# Patient Record
Sex: Female | Born: 1945
Health system: Southern US, Community
[De-identification: ages and names within clinical notes are randomized; demographics above are authoritative.]

## PROBLEM LIST (undated history)

## (undated) DIAGNOSIS — T7840XA Allergy, unspecified, initial encounter: Secondary | ICD-10-CM

## (undated) DIAGNOSIS — Z860101 Personal history of adenomatous and serrated colon polyps: Secondary | ICD-10-CM

## (undated) DIAGNOSIS — Z86010 Personal history of colonic polyps: Secondary | ICD-10-CM

## (undated) DIAGNOSIS — M1990 Unspecified osteoarthritis, unspecified site: Secondary | ICD-10-CM

## (undated) DIAGNOSIS — K219 Gastro-esophageal reflux disease without esophagitis: Secondary | ICD-10-CM

## (undated) DIAGNOSIS — H269 Unspecified cataract: Secondary | ICD-10-CM

## (undated) DIAGNOSIS — M8580 Other specified disorders of bone density and structure, unspecified site: Secondary | ICD-10-CM

## (undated) DIAGNOSIS — M810 Age-related osteoporosis without current pathological fracture: Secondary | ICD-10-CM

## (undated) DIAGNOSIS — E785 Hyperlipidemia, unspecified: Secondary | ICD-10-CM

## (undated) HISTORY — PX: COLONOSCOPY: SHX174

## (undated) HISTORY — DX: Age-related osteoporosis without current pathological fracture: M81.0

## (undated) HISTORY — DX: Hyperlipidemia, unspecified: E78.5

## (undated) HISTORY — DX: Personal history of adenomatous and serrated colon polyps: Z86.0101

## (undated) HISTORY — DX: Personal history of colonic polyps: Z86.010

## (undated) HISTORY — DX: Allergy, unspecified, initial encounter: T78.40XA

## (undated) HISTORY — DX: Unspecified osteoarthritis, unspecified site: M19.90

## (undated) HISTORY — DX: Gastro-esophageal reflux disease without esophagitis: K21.9

## (undated) HISTORY — DX: Unspecified cataract: H26.9

## (undated) HISTORY — DX: Other specified disorders of bone density and structure, unspecified site: M85.80

---

## 1995-12-14 HISTORY — PX: ABDOMINAL HYSTERECTOMY: SHX81

## 1999-03-17 ENCOUNTER — Other Ambulatory Visit: Admission: RE | Admit: 1999-03-17 | Discharge: 1999-03-17 | Payer: Self-pay | Admitting: Radiology

## 2000-06-30 ENCOUNTER — Other Ambulatory Visit: Admission: RE | Admit: 2000-06-30 | Discharge: 2000-06-30 | Payer: Self-pay | Admitting: *Deleted

## 2001-07-27 ENCOUNTER — Other Ambulatory Visit: Admission: RE | Admit: 2001-07-27 | Discharge: 2001-07-27 | Payer: Self-pay | Admitting: *Deleted

## 2002-05-31 ENCOUNTER — Ambulatory Visit (HOSPITAL_COMMUNITY): Admission: RE | Admit: 2002-05-31 | Discharge: 2002-05-31 | Payer: Self-pay | Admitting: Gastroenterology

## 2002-06-12 LAB — HM COLONOSCOPY

## 2002-10-09 ENCOUNTER — Other Ambulatory Visit: Admission: RE | Admit: 2002-10-09 | Discharge: 2002-10-09 | Payer: Self-pay | Admitting: Family Medicine

## 2004-10-16 ENCOUNTER — Ambulatory Visit: Payer: Self-pay | Admitting: Family Medicine

## 2004-10-17 ENCOUNTER — Ambulatory Visit: Payer: Self-pay | Admitting: Family Medicine

## 2004-10-19 ENCOUNTER — Ambulatory Visit: Payer: Self-pay | Admitting: Family Medicine

## 2004-10-27 ENCOUNTER — Ambulatory Visit: Payer: Self-pay | Admitting: Family Medicine

## 2004-10-30 ENCOUNTER — Ambulatory Visit: Payer: Self-pay | Admitting: Family Medicine

## 2004-11-04 ENCOUNTER — Ambulatory Visit: Payer: Self-pay | Admitting: Family Medicine

## 2004-11-04 ENCOUNTER — Other Ambulatory Visit: Admission: RE | Admit: 2004-11-04 | Discharge: 2004-11-04 | Payer: Self-pay | Admitting: Family Medicine

## 2004-12-25 ENCOUNTER — Ambulatory Visit: Payer: Self-pay | Admitting: Family Medicine

## 2006-02-07 ENCOUNTER — Ambulatory Visit: Payer: Self-pay | Admitting: Family Medicine

## 2006-03-21 ENCOUNTER — Ambulatory Visit: Payer: Self-pay | Admitting: Gastroenterology

## 2006-03-24 ENCOUNTER — Ambulatory Visit: Payer: Self-pay | Admitting: Family Medicine

## 2006-07-13 ENCOUNTER — Ambulatory Visit: Payer: Self-pay | Admitting: Family Medicine

## 2007-06-01 ENCOUNTER — Telehealth: Payer: Self-pay | Admitting: Family Medicine

## 2007-06-26 ENCOUNTER — Encounter: Payer: Self-pay | Admitting: Family Medicine

## 2007-07-05 ENCOUNTER — Encounter (INDEPENDENT_AMBULATORY_CARE_PROVIDER_SITE_OTHER): Payer: Self-pay | Admitting: *Deleted

## 2007-07-14 ENCOUNTER — Encounter: Payer: Self-pay | Admitting: Internal Medicine

## 2007-07-14 DIAGNOSIS — E785 Hyperlipidemia, unspecified: Secondary | ICD-10-CM

## 2007-07-14 DIAGNOSIS — L309 Dermatitis, unspecified: Secondary | ICD-10-CM

## 2007-07-14 DIAGNOSIS — J309 Allergic rhinitis, unspecified: Secondary | ICD-10-CM | POA: Insufficient documentation

## 2007-07-19 DIAGNOSIS — M810 Age-related osteoporosis without current pathological fracture: Secondary | ICD-10-CM

## 2007-07-24 ENCOUNTER — Ambulatory Visit: Payer: Self-pay | Admitting: Family Medicine

## 2007-07-27 LAB — CONVERTED CEMR LAB
ALT: 21 units/L (ref 0–35)
AST: 26 units/L (ref 0–37)
BUN: 12 mg/dL (ref 6–23)
Basophils Absolute: 0 10*3/uL (ref 0.0–0.1)
Basophils Relative: 0.7 % (ref 0.0–1.0)
CO2: 30 meq/L (ref 19–32)
Calcium: 9 mg/dL (ref 8.4–10.5)
Chloride: 103 meq/L (ref 96–112)
Cholesterol: 152 mg/dL (ref 0–200)
Creatinine, Ser: 0.7 mg/dL (ref 0.4–1.2)
Eosinophils Absolute: 0.1 10*3/uL (ref 0.0–0.6)
Eosinophils Relative: 2.5 % (ref 0.0–5.0)
GFR calc Af Amer: 109 mL/min
GFR calc non Af Amer: 90 mL/min
Glucose, Bld: 91 mg/dL (ref 70–99)
HCT: 40 % (ref 36.0–46.0)
HDL: 63.6 mg/dL (ref >39.0)
Hemoglobin: 13.7 g/dL (ref 12.0–15.0)
LDL Cholesterol: 76 mg/dL (ref 0–99)
Lymphocytes Relative: 25 % (ref 12.0–46.0)
MCHC: 34.3 g/dL (ref 30.0–36.0)
MCV: 95.4 fL (ref 78.0–100.0)
Monocytes Absolute: 0.4 10*3/uL (ref 0.2–0.7)
Monocytes Relative: 7.1 % (ref 3.0–11.0)
Neutro Abs: 3.6 10*3/uL (ref 1.4–7.7)
Neutrophils Relative %: 64.7 % (ref 43.0–77.0)
Platelets: 240 10*3/uL (ref 150–400)
Potassium: 4.4 meq/L (ref 3.5–5.1)
RBC: 4.19 M/uL (ref 3.87–5.11)
RDW: 11.4 % — ABNORMAL LOW (ref 11.5–14.6)
Sodium: 139 meq/L (ref 135–145)
Total CHOL/HDL Ratio: 2.4
Triglycerides: 60 mg/dL (ref 0–149)
VLDL: 12 mg/dL (ref 0–40)
WBC: 5.5 10*3/uL (ref 4.5–10.5)

## 2007-09-21 ENCOUNTER — Encounter: Payer: Self-pay | Admitting: Family Medicine

## 2007-09-22 ENCOUNTER — Telehealth (INDEPENDENT_AMBULATORY_CARE_PROVIDER_SITE_OTHER): Payer: Self-pay | Admitting: *Deleted

## 2007-09-26 ENCOUNTER — Ambulatory Visit: Payer: Self-pay | Admitting: Family Medicine

## 2007-10-31 ENCOUNTER — Telehealth (INDEPENDENT_AMBULATORY_CARE_PROVIDER_SITE_OTHER): Payer: Self-pay | Admitting: *Deleted

## 2007-12-21 ENCOUNTER — Encounter (INDEPENDENT_AMBULATORY_CARE_PROVIDER_SITE_OTHER): Payer: Self-pay | Admitting: Internal Medicine

## 2008-01-05 ENCOUNTER — Ambulatory Visit: Payer: Self-pay | Admitting: Family Medicine

## 2008-02-06 ENCOUNTER — Ambulatory Visit: Payer: Self-pay | Admitting: Family Medicine

## 2008-07-15 ENCOUNTER — Encounter (INDEPENDENT_AMBULATORY_CARE_PROVIDER_SITE_OTHER): Payer: Self-pay | Admitting: Internal Medicine

## 2008-07-17 ENCOUNTER — Encounter (INDEPENDENT_AMBULATORY_CARE_PROVIDER_SITE_OTHER): Payer: Self-pay | Admitting: Internal Medicine

## 2008-07-17 ENCOUNTER — Encounter (INDEPENDENT_AMBULATORY_CARE_PROVIDER_SITE_OTHER): Payer: Self-pay | Admitting: *Deleted

## 2008-10-11 ENCOUNTER — Ambulatory Visit: Payer: Self-pay | Admitting: Internal Medicine

## 2008-10-11 ENCOUNTER — Encounter (INDEPENDENT_AMBULATORY_CARE_PROVIDER_SITE_OTHER): Payer: Self-pay | Admitting: Internal Medicine

## 2008-10-11 ENCOUNTER — Other Ambulatory Visit: Admission: RE | Admit: 2008-10-11 | Discharge: 2008-10-11 | Payer: Self-pay | Admitting: Family Medicine

## 2008-10-11 LAB — CONVERTED CEMR LAB: Pap Smear: NORMAL

## 2008-10-11 LAB — HM PAP SMEAR

## 2008-10-14 LAB — CONVERTED CEMR LAB
ALT: 29 units/L (ref 0–35)
AST: 31 units/L (ref 0–37)
BUN: 11 mg/dL (ref 6–23)
CO2: 31 meq/L (ref 19–32)
Calcium: 9 mg/dL (ref 8.4–10.5)
Chloride: 107 meq/L (ref 96–112)
Cholesterol: 175 mg/dL (ref 0–200)
GFR calc Af Amer: 108 mL/min
GFR calc non Af Amer: 90 mL/min
Glucose, Bld: 92 mg/dL (ref 70–99)
HDL: 74.2 mg/dL (ref >39.0)
LDL Cholesterol: 87 mg/dL (ref 0–99)
Potassium: 4.4 meq/L (ref 3.5–5.1)
Sodium: 143 meq/L (ref 135–145)
Total CHOL/HDL Ratio: 2.4
Triglycerides: 67 mg/dL (ref 0–149)
VLDL: 13 mg/dL (ref 0–40)

## 2008-10-17 ENCOUNTER — Encounter (INDEPENDENT_AMBULATORY_CARE_PROVIDER_SITE_OTHER): Payer: Self-pay | Admitting: *Deleted

## 2008-11-01 ENCOUNTER — Ambulatory Visit: Payer: Self-pay | Admitting: Family Medicine

## 2008-11-01 LAB — FECAL OCCULT BLOOD, GUAIAC: Fecal Occult Blood: NEGATIVE

## 2008-11-11 ENCOUNTER — Encounter (INDEPENDENT_AMBULATORY_CARE_PROVIDER_SITE_OTHER): Payer: Self-pay | Admitting: *Deleted

## 2008-11-11 LAB — CONVERTED CEMR LAB
OCCULT 1: NEGATIVE
OCCULT 3: NEGATIVE

## 2008-11-13 ENCOUNTER — Telehealth (INDEPENDENT_AMBULATORY_CARE_PROVIDER_SITE_OTHER): Payer: Self-pay | Admitting: *Deleted

## 2009-04-07 ENCOUNTER — Ambulatory Visit: Payer: Self-pay | Admitting: Family Medicine

## 2009-04-09 LAB — CONVERTED CEMR LAB
ALT: 20 units/L (ref 0–35)
AST: 27 units/L (ref 0–37)
Cholesterol: 168 mg/dL (ref 0–200)
HDL: 65 mg/dL (ref >39.00)
LDL Cholesterol: 89 mg/dL (ref 0–99)
Total CHOL/HDL Ratio: 3
Triglycerides: 71 mg/dL (ref 0.0–149.0)
VLDL: 14.2 mg/dL (ref 0.0–40.0)

## 2009-05-03 ENCOUNTER — Ambulatory Visit: Payer: Self-pay | Admitting: Family Medicine

## 2009-05-05 ENCOUNTER — Ambulatory Visit: Payer: Self-pay | Admitting: Family Medicine

## 2009-05-14 ENCOUNTER — Ambulatory Visit: Payer: Self-pay | Admitting: Family Medicine

## 2009-05-21 ENCOUNTER — Encounter (INDEPENDENT_AMBULATORY_CARE_PROVIDER_SITE_OTHER): Payer: Self-pay | Admitting: Internal Medicine

## 2009-05-21 ENCOUNTER — Ambulatory Visit: Payer: Self-pay | Admitting: Family Medicine

## 2009-07-24 ENCOUNTER — Encounter: Payer: Self-pay | Admitting: Family Medicine

## 2009-08-01 ENCOUNTER — Encounter (INDEPENDENT_AMBULATORY_CARE_PROVIDER_SITE_OTHER): Payer: Self-pay | Admitting: *Deleted

## 2009-10-07 ENCOUNTER — Ambulatory Visit: Payer: Self-pay | Admitting: Family Medicine

## 2009-10-15 ENCOUNTER — Ambulatory Visit: Payer: Self-pay | Admitting: Family Medicine

## 2009-10-15 DIAGNOSIS — N952 Postmenopausal atrophic vaginitis: Secondary | ICD-10-CM

## 2009-10-15 LAB — CONVERTED CEMR LAB
Cholesterol, target level: 200 mg/dL
HDL goal, serum: 40 mg/dL
LDL Goal: 160 mg/dL

## 2009-10-31 ENCOUNTER — Ambulatory Visit: Payer: Self-pay | Admitting: Family Medicine

## 2009-11-04 LAB — CONVERTED CEMR LAB
ALT: 26 units/L (ref 0–35)
AST: 26 units/L (ref 0–37)
Albumin: 4.4 g/dL (ref 3.5–5.2)
BUN: 13 mg/dL (ref 6–23)
Basophils Absolute: 0 10*3/uL (ref 0.0–0.1)
Basophils Relative: 0.7 % (ref 0.0–3.0)
CO2: 33 meq/L — ABNORMAL HIGH (ref 19–32)
Calcium: 9.2 mg/dL (ref 8.4–10.5)
Chloride: 105 meq/L (ref 96–112)
Cholesterol: 173 mg/dL (ref 0–200)
Creatinine, Ser: 0.7 mg/dL (ref 0.4–1.2)
Eosinophils Absolute: 0.1 10*3/uL (ref 0.0–0.7)
Eosinophils Relative: 2.7 % (ref 0.0–5.0)
GFR calc non Af Amer: 89.08 mL/min (ref >60)
HCT: 41.6 % (ref 36.0–46.0)
HDL: 67.4 mg/dL (ref >39.00)
Hemoglobin: 14.2 g/dL (ref 12.0–15.0)
LDL Cholesterol: 95 mg/dL (ref 0–99)
Lymphocytes Relative: 26.1 % (ref 12.0–46.0)
Lymphs Abs: 1.4 10*3/uL (ref 0.7–4.0)
MCHC: 34.2 g/dL (ref 30.0–36.0)
MCV: 97.8 fL (ref 78.0–100.0)
Monocytes Absolute: 0.4 10*3/uL (ref 0.1–1.0)
Monocytes Relative: 7.5 % (ref 3.0–12.0)
Neutro Abs: 3.4 10*3/uL (ref 1.4–7.7)
Neutrophils Relative %: 63 % (ref 43.0–77.0)
Phosphorus: 3.9 mg/dL (ref 2.3–4.6)
Platelets: 242 10*3/uL (ref 150.0–400.0)
Potassium: 4.5 meq/L (ref 3.5–5.1)
RBC: 4.25 M/uL (ref 3.87–5.11)
RDW: 11.6 % (ref 11.5–14.6)
Sodium: 142 meq/L (ref 135–145)
TSH: 2.06 microintl units/mL (ref 0.35–5.50)
Total CHOL/HDL Ratio: 3
Triglycerides: 54 mg/dL (ref 0.0–149.0)
VLDL: 10.8 mg/dL (ref 0.0–40.0)
Vit D, 25-Hydroxy: 50 ng/mL (ref 30–89)
WBC: 5.3 10*3/uL (ref 4.5–10.5)

## 2010-01-16 ENCOUNTER — Ambulatory Visit: Payer: Self-pay | Admitting: Family Medicine

## 2010-01-16 DIAGNOSIS — J3489 Other specified disorders of nose and nasal sinuses: Secondary | ICD-10-CM | POA: Insufficient documentation

## 2010-02-02 ENCOUNTER — Telehealth: Payer: Self-pay | Admitting: Family Medicine

## 2010-07-07 ENCOUNTER — Ambulatory Visit: Payer: Self-pay | Admitting: Family Medicine

## 2010-07-07 DIAGNOSIS — K219 Gastro-esophageal reflux disease without esophagitis: Secondary | ICD-10-CM

## 2010-08-18 ENCOUNTER — Telehealth: Payer: Self-pay | Admitting: Family Medicine

## 2010-09-01 ENCOUNTER — Ambulatory Visit: Payer: Self-pay | Admitting: Family Medicine

## 2010-09-01 ENCOUNTER — Telehealth: Payer: Self-pay | Admitting: Family Medicine

## 2010-09-02 LAB — CONVERTED CEMR LAB: Vit D, 25-Hydroxy: 38 ng/mL (ref 30–89)

## 2010-09-03 LAB — CONVERTED CEMR LAB
ALT: 27 units/L (ref 0–35)
AST: 30 units/L (ref 0–37)
Albumin: 4.2 g/dL (ref 3.5–5.2)
BUN: 15 mg/dL (ref 6–23)
Basophils Absolute: 0 10*3/uL (ref 0.0–0.1)
Basophils Relative: 0.7 % (ref 0.0–3.0)
CO2: 32 meq/L (ref 19–32)
Calcium: 9.7 mg/dL (ref 8.4–10.5)
Chloride: 103 meq/L (ref 96–112)
Cholesterol: 244 mg/dL — ABNORMAL HIGH (ref 0–200)
Creatinine, Ser: 0.8 mg/dL (ref 0.4–1.2)
Direct LDL: 151.5 mg/dL
Eosinophils Absolute: 0.3 10*3/uL (ref 0.0–0.7)
Eosinophils Relative: 6.5 % — ABNORMAL HIGH (ref 0.0–5.0)
GFR calc non Af Amer: 77.28 mL/min (ref >60)
Glucose, Bld: 91 mg/dL (ref 70–99)
HCT: 40 % (ref 36.0–46.0)
HDL: 64.7 mg/dL (ref >39.00)
Hemoglobin: 13.6 g/dL (ref 12.0–15.0)
Lymphocytes Relative: 32.6 % (ref 12.0–46.0)
Lymphs Abs: 1.4 10*3/uL (ref 0.7–4.0)
MCHC: 34 g/dL (ref 30.0–36.0)
MCV: 96.3 fL (ref 78.0–100.0)
Monocytes Absolute: 0.3 10*3/uL (ref 0.1–1.0)
Monocytes Relative: 7.5 % (ref 3.0–12.0)
Neutro Abs: 2.3 10*3/uL (ref 1.4–7.7)
Neutrophils Relative %: 52.7 % (ref 43.0–77.0)
Phosphorus: 3.5 mg/dL (ref 2.3–4.6)
Platelets: 267 10*3/uL (ref 150.0–400.0)
Potassium: 4.4 meq/L (ref 3.5–5.1)
RBC: 4.16 M/uL (ref 3.87–5.11)
RDW: 12.3 % (ref 11.5–14.6)
Sodium: 142 meq/L (ref 135–145)
TSH: 2.92 microintl units/mL (ref 0.35–5.50)
Total CHOL/HDL Ratio: 4
Triglycerides: 102 mg/dL (ref 0.0–149.0)
VLDL: 20.4 mg/dL (ref 0.0–40.0)
WBC: 4.3 10*3/uL — ABNORMAL LOW (ref 4.5–10.5)

## 2010-10-22 ENCOUNTER — Ambulatory Visit: Payer: Self-pay | Admitting: Family Medicine

## 2010-12-02 ENCOUNTER — Ambulatory Visit: Payer: Self-pay | Admitting: Family Medicine

## 2010-12-09 LAB — CONVERTED CEMR LAB
ALT: 24 units/L (ref 0–35)
AST: 30 units/L (ref 0–37)
Cholesterol: 179 mg/dL (ref 0–200)
HDL: 68.8 mg/dL (ref >39.00)
LDL Cholesterol: 98 mg/dL (ref 0–99)
Total CHOL/HDL Ratio: 3
Triglycerides: 60 mg/dL (ref 0.0–149.0)
VLDL: 12 mg/dL (ref 0.0–40.0)

## 2011-01-06 ENCOUNTER — Encounter: Payer: Self-pay | Admitting: Family Medicine

## 2011-01-06 LAB — HM MAMMOGRAPHY: HM Mammogram: NORMAL

## 2011-01-08 ENCOUNTER — Encounter (INDEPENDENT_AMBULATORY_CARE_PROVIDER_SITE_OTHER): Payer: Self-pay | Admitting: *Deleted

## 2011-01-10 LAB — CONVERTED CEMR LAB
BUN: 16 mg/dL (ref 6–23)
Basophils Absolute: 0 10*3/uL (ref 0.0–0.1)
Basophils Relative: 1 % (ref 0–1)
CO2: 20 meq/L (ref 19–32)
Chloride: 107 meq/L (ref 96–112)
Creatinine, Ser: 0.84 mg/dL (ref 0.40–1.20)
Eosinophils Absolute: 0.2 10*3/uL (ref 0.0–0.7)
Eosinophils Relative: 3 % (ref 0–5)
Glucose, Bld: 110 mg/dL — ABNORMAL HIGH (ref 70–99)
HCT: 35.9 % — ABNORMAL LOW (ref 36.0–46.0)
Hemoglobin: 12.1 g/dL (ref 12.0–15.0)
Lymphocytes Relative: 36 % (ref 12–46)
Lymphs Abs: 1.9 10*3/uL (ref 0.7–4.0)
MCHC: 33.7 g/dL (ref 30.0–36.0)
MCV: 91.8 fL (ref 78.0–100.0)
Monocytes Absolute: 0.6 10*3/uL (ref 0.1–1.0)
Monocytes Relative: 11 % (ref 3–12)
Neutro Abs: 2.6 10*3/uL (ref 1.7–7.7)
Neutrophils Relative %: 49 % (ref 43–77)
Platelets: 271 10*3/uL (ref 150–400)
Potassium: 4.2 meq/L (ref 3.5–5.3)
RBC: 3.91 M/uL (ref 3.87–5.11)
RDW: 11.9 % (ref 11.5–15.5)
Rhuematoid fact SerPl-aCnc: <20 intl units/mL (ref 0–20)
Sodium: 140 meq/L (ref 135–145)
WBC: 5.2 10*3/uL (ref 4.0–10.5)

## 2011-01-14 NOTE — Assessment & Plan Note (Signed)
Summary: SINUS PROBLEMS / LFW   Vital Signs:  Patient profile:   65 year old female Height:      63.5 inches Weight:      139.25 pounds BMI:     24.37 Temp:     98 degrees F oral Pulse rate:   72 / minute Pulse rhythm:   regular BP sitting:   132 / 80  (left arm) Cuff size:   regular  Vitals Entered By: Delilah Shan CMA Duncan Dull) (January 16, 2010 4:06 PM) CC: Sinus problems   History of Present Illness: 65 yo patient new to me here for sinus problems.  Leaving for [place] in a couple of weeks and she wants to make sure she doesn't have an infection. For the last week, has had some pressure or strange sensation when she wakes up on the left side of her face (maxillary sinus area).  Does not feel congested.  Goes away once she blows her nose. No fevers, chills, cough or other symptoms.  Leaving for [place], needs Malronone and Cipro.  Current Medications (verified): 1)  Vit D 400 Iu .... Take 1 Tablet By Mouth Once A Day 2)  Vit E .... 400 International Units Takes One Tablet Twice A Week. 3)  Fish Oil   Oil (Fish Oil) .Marland Kitchen.. 1000 Mg Every Day 4)  Lipitor 10 Mg Tabs (Atorvastatin Calcium) .Marland Kitchen.. 1 Once Daily For Cholesterol 5)  Multivitamins   Tabs (Multiple Vitamin) .... One Daily 6)  Viactiv Multi-Vitamin  Chew (Multiple Vitamins-Calcium) .... Take As Directed As Needed 7)  Premarin 0.625 Mg/gm Crea (Estrogens, Conjugated) .... Insert Small Amt (2 Cm) Intravaginally Twice A Week 8)  Malarone 250-100 Mg Tabs (Atovaquone-Proguanil Hcl) .Marland Kitchen.. 1 Tab By Mouth 2 Days Prior To Travel, Then 1 Tab Daily While in [place], Then 1 Tab Daily For 7 Days After Return 9)  Cipro 500 Mg Tabs (Ciprofloxacin Hcl) .Marland Kitchen.. 1 By Mouth 2 Times Daily X3 Days  Allergies: 1)  ! Fosamax 2)  ! Evista (Raloxifene Hcl) 3)  ! Sulfa  Review of Systems      See HPI ENT:  Complains of sinus pressure; denies earache, postnasal drainage, and sore throat.  Physical Exam  General:  Well-developed,well-nourished,in  no acute distress; alert,appropriate and cooperative throughout examination Ears:  R ear normal and L ear normal.   Nose:  no nasal discharge.   No tenderness to palp. Mouth:  pharynx pink and moist.   Lungs:  Normal respiratory effort, chest expands symmetrically. Lungs are clear to auscultation, no crackles or wheezes. Heart:  Normal rate and regular rhythm. S1 and S2 normal without gallop, murmur, click, rub or other extra sounds. Psych:  normal affect, talkative and pleasant    Impression & Recommendations:  Problem # 1:  SINUS PAIN (ICD-478.19) Assessment New No signs of inflammation or infeciton on exam. ?early sinusitis vs structural issue (i.e. polyp) or even early shingles (had zostavax). Advised to call before she leaves to [place] to let me know how symptoms are progressing. Pt in agreement with plan.  Complete Medication List: 1)  Vit D 400 Iu  .... Take 1 tablet by mouth once a day 2)  Vit E  .... 400 international units takes one tablet twice a week. 3)  Fish Oil Oil (Fish oil) .Marland Kitchen.. 1000 mg every day 4)  Lipitor 10 Mg Tabs (Atorvastatin calcium) .Marland Kitchen.. 1 once daily for cholesterol 5)  Multivitamins Tabs (Multiple vitamin) .... One daily 6)  Viactiv Multi-vitamin Chew (  Multiple vitamins-calcium) .... Take as directed as needed 7)  Premarin 0.625 Mg/gm Crea (Estrogens, conjugated) .... Insert small amt (2 cm) intravaginally twice a week 8)  Malarone 250-100 Mg Tabs (Atovaquone-proguanil hcl) .Marland Kitchen.. 1 tab by mouth 2 days prior to travel, then 1 tab daily while in [place], then 1 tab daily for 7 days after return 9)  Cipro 500 Mg Tabs (Ciprofloxacin hcl) .Marland Kitchen.. 1 by mouth 2 times daily x3 days Prescriptions: CIPRO 500 MG TABS (CIPROFLOXACIN HCL) 1 by mouth 2 times daily x3 days  #6 x 0   Entered and Authorized by:   Ruthe Mannan MD   Signed by:   Ruthe Mannan MD on 01/16/2010   Method used:   Print then Give to Patient   RxID:   [id number] MALARONE 250-100 MG TABS  (ATOVAQUONE-PROGUANIL HCL) 1 tab by mouth 2 days prior to travel, then 1 tab daily while in [place], then 1 tab daily for 7 days after return  #24 x 0   Entered and Authorized by:   Ruthe Mannan MD   Signed by:   Ruthe Mannan MD on 01/16/2010   Method used:   Print then Give to Patient   RxID:   [id number]   Current Allergies (reviewed today): ! FOSAMAX ! EVISTA (RALOXIFENE HCL) ! SULFA

## 2011-01-14 NOTE — Progress Notes (Signed)
Summary: Needs rx for [facility]  Phone Note Call from Patient Call back at Home Phone [phone]   Caller: Patient Call For: Judith Part MD Summary of Call: Patient will be going out of the country the end of the month and needs a rx for [facility] medication and in the past she has used Malarone.  Pharmacy-[facility]. Initial call taken by: Sydell Axon LPN,  August 18, 2010 9:22 AM  Follow-up for Phone Call        let me know where she is going -- I think [place] in past I wrote px for #30-- if she needs more I can change it -- takes 2 d before , through trip and 7 days after  px written on EMR for call in  Follow-up by: Judith Part MD,  August 18, 2010 10:21 AM  Additional Follow-up for Phone Call Additional follow up Details #1::        Left message for patient to call back. Lewanda Rife LPN  August 18, 2010 2:37 PM   Left message for patient to call back. Lewanda Rife LPN  August 19, 2010 11:13 AM   Pt left v/m and for me to call her at [phone]. I tried to reach pt and left message on pt's v/m for pt to return my call.Lewanda Rife LPN  August 19, 2010 3:04 PM     Additional Follow-up for Phone Call Additional follow up Details #2::    Pt is going to [place], leaving 9/24 and returning 10/01.                   Lowella Petties CMA  August 20, 2010 8:43 AM   New/Updated Medications: MALARONE 250-100 MG TABS (ATOVAQUONE-PROGUANIL HCL) 1 tab by mouth 2 days prior to travel, then 1 tab daily while out of the country, then 1 tab daily for 7 days after return as needed Prescriptions: MALARONE 250-100 MG TABS (ATOVAQUONE-PROGUANIL HCL) 1 tab by mouth 2 days prior to travel, then 1 tab daily while out of the country, then 1 tab daily for 7 days after return as needed  #30 x 0   Entered and Authorized by:   Judith Part MD   Signed by:   Lewanda Rife LPN on 13/07/6577   Method used:   Telephoned to ...       [facility] [address] [phone]* (retail)  [address], [place]  [address]       Ph: [phone]       Fax: [phone]   RxID:   [id number]   Appended Document: Needs rx for Danie Binder Medication phoned to [facility]. pharmacy as instructed.

## 2011-01-14 NOTE — Progress Notes (Signed)
Summary: Bone Density appt cancelled per pt  Phone Note Outgoing Call   Call placed by: Daine Gip,  February 02, 2010 9:27 AM Summary of Call: Pt cancelled the order for the Bone Density scheduled at [facility] for 10/31/2009--pt did not want to reschedule...fyi to Dr. Milinda Antis.Daine Gip  February 02, 2010 9:32 AM  Initial call taken by: Daine Gip,  February 02, 2010 9:32 AM

## 2011-01-14 NOTE — Assessment & Plan Note (Signed)
Summary: ACID REFLUX/CLE   Vital Signs:  Patient profile:   65 year old female Height:      63.5 inches Weight:      136.25 pounds BMI:     23.84 Temp:     97.6 degrees F oral Pulse rate:   76 / minute Pulse rhythm:   regular BP sitting:   124 / 80  (left arm) Cuff size:   regular  Vitals Entered By: Lewanda Rife LPN (July 07, 2010 12:17 PM) CC: acid reflux   History of Present Illness: acid reflux has returned with a "vengance"   used to take nexium-- and it worked well  has been several years since she had it   thinks the coffee has stirred things up   symptom is heartburn- in the chest   coffee is important to her -- drinks a whole pot in the am  really likes it  she refuses to quit it  may be able to cut back   she took herself off lipitor in january  was really stiff and sore  at first thought it was age rel and then realized that it was worsening  symptoms got better off of it   diet is very good   does exercise -and enjoys that     Allergies: 1)  ! Fosamax 2)  ! Evista (Raloxifene Hcl) 3)  ! Sulfa 4)  ! Pepcid 5)  ! 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 Eyes:  Denies blurring. CV:  Denies difficulty breathing at night, fainting, palpitations, shortness of  breath with exertion, and swelling of feet. Resp:  Denies cough, pleuritic, shortness of breath, and wheezing. GI:  Complains of indigestion; denies abdominal pain, bloody stools, change in bowel habits, dark tarry stools, diarrhea, nausea, and vomiting. GU:  Denies dysuria, hematuria, and urinary frequency. MS:  Denies joint pain. Derm:  Denies lesion(s), poor wound healing, and rash. Neuro:  Denies headaches. Psych:  mood is good- no stress . Endo:  Denies cold intolerance, excessive thirst, excessive urination, and heat intolerance. Heme:  Denies abnormal bruising and bleeding.  Physical Exam  General:  Well-developed,well-nourished,in no acute distress; alert,appropriate and cooperative throughout examination Head:  Normocephalic and atraumatic without obvious abnormalities.  Eyes:  vision grossly intact, pupils equal, pupils round, and pupils reactive to light.  no conjunctival pallor, injection or icterus  Mouth:  pharynx pink and moist.   Neck:  supple with full rom and no masses or thyromegally, no JVD or carotid bruit  Lungs:  Normal respiratory effort, chest expands symmetrically. Lungs are clear to auscultation, no crackles or wheezes. Heart:  Normal rate and regular rhythm. S1 and S2 normal without gallop, murmur, click, rub or other extra sounds. Abdomen:  Bowel sounds positive,abdomen soft and non-tender without masses, organomegaly or hernias noted. no renal bruits  Msk:  No deformity or scoliosis noted of thoracic or lumbar spine.  no acute joint changes  Extremities:  No clubbing, cyanosis, edema, or deformity noted with normal full range of motion of all joints.   Skin:  Intact without suspicious lesions or rashes Cervical Nodes:  No lymphadenopathy noted Psych:  normal affect, talkative and pleasant  not seemingly stressed    Impression & Recommendations:  Problem # 1:  GERD (ICD-530.81) Assessment New  this is worsened by coffee - and pt does not think she can  quit that (enc to cut down) disc opt of low acid/ decaf- etc start nexium 40 - had failed pepcid and prilosec otc  disc diet - what to avoid udpate in 2 wk if symptoms do not resolve  Her updated medication list for this problem includes:    Nexium 40 Mg Cpdr (Esomeprazole magnesium) .Marland Kitchen... 1 by mouth once daily in am about 30 minutes before breakfast  Orders: Prescription Created Electronically [phone])  Problem # 2:  HYPERLIPIDEMIA (ICD-272.4) Assessment: Deteriorated  pt stopped lipitor due to muscle and joint pain and stiffness will watch diet - we disc low sat fat diet  lab in sept and then check up in nov The following medications were removed from the medication list:    Lipitor 10 Mg Tabs (Atorvastatin calcium) .Marland Kitchen... 1 once daily for cholesterol  Labs Reviewed: SGOT: 26 (10/31/2009)   SGPT: 26 (10/31/2009)  Lipid Goals: Chol Goal: 200 (10/15/2009)   HDL Goal: 40 (10/15/2009)   LDL Goal: 160 (10/15/2009)   TG Goal: 150 (10/15/2009)  Prior 10 Yr Risk Heart Disease: 2 % (10/15/2009)   HDL:67.40 (10/31/2009), 65.00 (04/07/2009)  LDL:95 (10/31/2009), 89 (04/07/2009)  Chol:173 (10/31/2009), 168 (04/07/2009)  Trig:54.0 (10/31/2009), 71.0 (04/07/2009)  Complete Medication List: 1)  Vit D 400 Iu  .... Take 1 tablet by mouth once a day 2)  Fish Oil Oil (Fish oil) .Marland Kitchen.. 1000 mg every day 3)  Multivitamins Tabs (Multiple vitamin) .... One daily 4)  Viactiv Multi-vitamin Chew (Multiple vitamins-calcium) .... Take as directed as needed 5)  Malarone 250-100 Mg Tabs (Atovaquone-proguanil hcl) .Marland Kitchen.. 1 tab by mouth 2 days prior to travel, then 1 tab daily while in [place], then 1 tab daily for 7 days after return as needed 6)  Nexium 40 Mg Cpdr (Esomeprazole magnesium) .Marland Kitchen.. 1 by mouth once daily in am about 30 minutes before breakfast  Patient Instructions: 1)  you can raise your HDL (good cholesterol) by increasing exercise and eating omega 3 fatty acid supplement like fish oil or flax seed  oil over the counter 2)  you can lower LDL (bad cholesterol) by limiting saturated fats in diet like red meat, fried foods, egg yolks, fatty breakfast meats, high fat dairy products and shellfish  3)  start the nexium 40 mg 1 pill each am about 30 minutes before breakfast  4)  if reflux symptoms are not resolved in 2 weeks - call and let me know  5)  schedule fasting labs in late sept and then 30 minute check up in mid nov please -- lipid, ast/ast/ tsh/ vit D / cbc with diff/ renal 272, 733.0, GERD Prescriptions: NEXIUM 40 MG CPDR (ESOMEPRAZOLE MAGNESIUM) 1 by mouth once daily in am about 30 minutes before breakfast  #30 x 11   Entered and Authorized by:   Judith Part MD   Signed by:   Judith Part MD on 07/07/2010   Method used:   Electronically to        [facility]. [address] [phone]* (retail)       [address]  [address]       [place], [place]  [address]       Ph: [phone]       Fax: [phone]   RxID:   [id number]   Current Allergies (reviewed today): ! FOSAMAX ! EVISTA (RALOXIFENE HCL) ! SULFA ! PEPCID ! PRILOSEC LIPITOR

## 2011-01-14 NOTE — Miscellaneous (Signed)
Summary: Mammogram to flowsheet   Clinical Lists Changes  Observations: Added new observation of MAMMO DUE: 01/2012 (01/08/2011 9:44) Added new observation of MAMMOGRAM: normal (01/06/2011 9:44)      Preventive Care Screening  Mammogram:    Date:  01/06/2011    Next Due:  01/2012    Results:  normal

## 2011-01-14 NOTE — Assessment & Plan Note (Signed)
Summary: F/U AFTER LABS / LFW   Vital Signs:  Patient profile:   65 year old female Height:      63.5 inches Weight:      136.75 pounds BMI:     23.93 Temp:     97.5 degrees F oral Pulse rate:   72 / minute Pulse rhythm:   regular BP sitting:   134 / 86  (left arm) Cuff size:   regular  Vitals Entered By: Lewanda Rife LPN (October 22, 2010 8:38 AM) CC: 4 mth f/u after labs   History of Present Illness: here for f/u after labs for chol and other chronic problems   wt is stable   bp is 134/86 today   vit D is 38 not taking anything    lipids up off lipitor-- her LDL is 151 (was in 90s) she stopped lipitor due to myalgias that were intolerable  she knows it is heredity  wants to try generic -non generic is not an options   went out of country to [place] for mission trip  was a great trip ! raised money to build 3 homes  desire is to go back soon   tight boots gave her toe problem first toenail turned black -- is growing out more   Allergies: 1)  ! Fosamax 2)  ! Evista (Raloxifene Hcl) 3)  ! Sulfa 4)  ! Pepcid 5)  ! 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   Eyes:  vision grossly intact, pupils equal, pupils round, and pupils reactive to light.  no conjunctival pallor, injection or icterus  Mouth:  pharynx pink and moist.   Neck:  supple with full rom and no masses or thyromegally, no JVD or carotid bruit  Lungs:  Normal respiratory effort, chest expands symmetrically. Lungs are clear to auscultation, no crackles or wheezes. Heart:  Normal rate and regular rhythm. S1 and S2 normal without gallop, murmur, click, rub or other extra sounds. Abdomen:  no renal bruits  Msk:  No deformity or scoliosis noted of thoracic or lumbar spine.  no acute joint changes  Pulses:  R and L carotid,radial,femoral,dorsalis pedis and posterior tibial pulses are full and equal bilaterally Extremities:  No clubbing, cyanosis, edema, or deformity noted with normal full range of motion of all joints.   Neurologic:  sensation intact to light touch, gait normal, and DTRs symmetrical and normal.   Skin:  Intact without suspicious lesions or rashes Cervical Nodes:  No lymphadenopathy noted Psych:  normal affect, talkative and pleasant    Impression & Recommendations:  Problem # 1:  HYPERLIPIDEMIA (ICD-272.4) Assessment Deteriorated  had myalgias on lipitor cannot afford non generics trial of zocor and update lab 6 wk rev low sat fat diet  Her updated medication list for this problem includes:    Zocor 20 Mg Tabs (Simvastatin) .Marland Kitchen... 1 by mouth once daily in evening  with a low fat snack  Labs Reviewed: SGOT: 30 (09/01/2010)   SGPT: 27 (09/01/2010)  Lipid Goals: Chol Goal: 200 (10/15/2009)   HDL Goal: 40 (10/15/2009)   LDL Goal: 160 (10/15/2009)   TG Goal: 150 (10/15/2009)  Prior 10 Yr Risk Heart Disease: 2 % (10/15/2009)   HDL:64.70 (09/01/2010), 67.40 (10/31/2009)  LDL:95 (10/31/2009), 89 (04/07/2009)  Chol:244 (09/01/2010), 173 (10/31/2009)  Trig:102.0 (09/01/2010), 54.0 (10/31/2009)  Problem # 2:  OSTEOPENIA (ICD-733.90) Assessment: Unchanged rev  rec for ca and Vit D rev lab  would like vit D level a bit higher   Complete Medication List: 1)  Nexium 40 Mg Cpdr (Esomeprazole magnesium) .Marland Kitchen.. 1 by mouth once daily in am about 30 minutes before breakfast 2)  Zocor 20 Mg Tabs (Simvastatin) .Marland Kitchen.. 1 by mouth once daily in evening with a low fat snack 3)  Calcium  4)  Vitamin D   Other Orders: Flu Vaccine 60yrs + MEDICARE PATIENTS (Z6109) Administration Flu vaccine - MCR (U0454)  Patient Instructions: 1)  the current recommendation for calcium intake is 1200-1500 mg daily with 1000 IU of vitamin D  2)  cholesterol is high  3)  try zocor (simvastatin ) 20 mg daily 4)  if you get muscle aches and pains stop it and let me know  5)  continue the low sat fat diet  6)  schedule fasting labs in 6 weeks lipid/ast/alt  272  Prescriptions: ZOCOR 20 MG TABS (SIMVASTATIN) 1 by mouth once daily in evening with a low fat snack  #30 x 11   Entered and Authorized by:   Judith Part MD   Signed by:   Judith Part MD on 10/22/2010   Method used:   Print then Give to Patient   RxID:   [id number]    Orders Added: 1)  Flu Vaccine 68yrs + MEDICARE PATIENTS [Q2039] 2)  Administration Flu vaccine - MCR [G0008] 3)  Est. Patient Level III [30865]    Current Allergies (reviewed today): ! FOSAMAX ! EVISTA (RALOXIFENE HCL) ! SULFA ! PEPCID ! PRILOSEC LIPITOR Flu Vaccine Consent Questions     Do you have a history of severe allergic reactions to this vaccine? no    Any prior history of allergic reactions to egg and/or gelatin? no    Do you have a sensitivity to the preservative Thimersol? no    Do you have a past history of Guillan-Barre Syndrome? no    Do you currently have an acute febrile illness? no    Have you ever had a severe reaction to latex? no    Vaccine information given and explained to patient? yes    Are you currently pregnant? no    Lot Number:[id number]   Exp Date:06/12/2011   Site Given  Left Deltoid [id number] Lewanda Rife LPN  October 22, 2010 12:57 PM

## 2011-01-14 NOTE — Letter (Signed)
Summary: Results Follow up Letter  [place] at [facility]  [address] [place], [place] [address]   Phone: [phone]  Fax: [phone]    01/08/2011 MRN: [id number]  UYEN EICHHOLZ [address] [place], [place]  [address]  Dear Ms. Clifton Custard,  The following are the results of your recent test(s):  Test         Result    Pap Smear:        Normal _____  Not Normal _____ Comments: ______________________________________________________ Cholesterol: LDL(Bad cholesterol):         Your goal is less than:         HDL (Good cholesterol):       Your goal is more than: Comments:  ______________________________________________________ Mammogram:        Normal __X___  Not Normal _____ Comments: Repeat in 1 year  ___________________________________________________________________ Hemoccult:        Normal _____  Not normal _______ Comments:    _____________________________________________________________________ Other Tests:    We routinely do not discuss normal results over the telephone.  If you desire a copy of the results, or you have any questions about this information we can discuss them at your next office visit.   Sincerely,      Sharilyn Sites for Dr. Roxy Manns

## 2011-01-14 NOTE — Progress Notes (Signed)
Summary: needs script for cipro  Phone Note Call from Patient Call back at Home Phone [phone]   Caller: Patient Call For: Judith Part MD Summary of Call: Pt will be leaving for [place] in a few days.  Got a script for [facility] but also needs one for cipro.  Uses [facility] [address]  Please let pt know when sent in. Initial call taken by: Lowella Petties CMA,  September 01, 2010 9:57 AM  Follow-up for Phone Call        px written on EMR for call in  Follow-up by: Judith Part MD,  September 01, 2010 11:27 AM  Additional Follow-up for Phone Call Additional follow up Details #1::        Patient's husband notified as instructed by telephone. Medication phoned to [facility] as instructed. Lewanda Rife LPN  September 01, 2010 11:41 AM     New/Updated Medications: CIPRO 500 MG TABS (CIPROFLOXACIN HCL) 1 by mouth two times a day for 7 days as needed traveler's diarrhea Prescriptions: CIPRO 500 MG TABS (CIPROFLOXACIN HCL) 1 by mouth two times a day for 7 days as needed traveler's diarrhea  #14 x 0   Entered and Authorized by:   Judith Part MD   Signed by:   Lewanda Rife LPN on 47/82/9562   Method used:   Telephoned to ...       [facility] [address] [phone]* (retail)       [address] [place]  [address]       Ph: [phone]       Fax: [phone]   RxID:   [id number]

## 2011-03-03 ENCOUNTER — Telehealth: Payer: Self-pay | Admitting: *Deleted

## 2011-03-03 MED ORDER — CIPROFLOXACIN HCL 500 MG PO TABS: 500.0000 mg | ORAL_TABLET | Freq: Two times a day (BID) | ORAL | Status: AC

## 2011-03-03 MED ORDER — ATOVAQUONE-PROGUANIL HCL 250-100 MG PO TABS: 1.0000 | ORAL_TABLET | Freq: Every day | ORAL | Status: DC

## 2011-03-03 NOTE — Telephone Encounter (Signed)
Pt is going to [place] 4/1 through 4/6 and is asking for malarone and cipro to be called to [facility] [address]

## 2011-03-03 NOTE — Telephone Encounter (Signed)
Please call these into her pharmacy -thanks

## 2011-03-03 NOTE — Telephone Encounter (Signed)
Meds  Called to [facility]. As instructed. Pt notified as instructed by phone.

## 2011-03-04 ENCOUNTER — Telehealth: Payer: Self-pay | Admitting: *Deleted

## 2011-03-04 NOTE — Telephone Encounter (Signed)
Patient is asking when she should come in for labs. Her last labs were done in December. Please advise.

## 2011-03-04 NOTE — Telephone Encounter (Signed)
Pt notified as instructed by telephone. Pt said she would schedule fasting lab work on 06/02/11 at 8:30am and then after results if Dr Milinda Antis wanted to see pt she would schedule appt with Dr Milinda Antis.

## 2011-03-04 NOTE — Telephone Encounter (Signed)
Around June or July-- for 6 month f/u lipid and ast/alt for 272 please--thanks

## 2011-06-02 ENCOUNTER — Other Ambulatory Visit (INDEPENDENT_AMBULATORY_CARE_PROVIDER_SITE_OTHER): Payer: Medicare Other | Admitting: Family Medicine

## 2011-06-02 DIAGNOSIS — E7800 Pure hypercholesterolemia, unspecified: Secondary | ICD-10-CM

## 2011-06-02 LAB — LIPID PANEL
LDL Cholesterol: 97 mg/dL (ref 0–99)
Total CHOL/HDL Ratio: 3
VLDL: 16.4 mg/dL (ref 0.0–40.0)

## 2011-06-02 LAB — ALT: ALT: 24 U/L (ref 0–35)

## 2011-06-02 LAB — AST: AST: 27 U/L (ref 0–37)

## 2011-06-09 ENCOUNTER — Telehealth: Payer: Self-pay

## 2011-06-09 NOTE — Telephone Encounter (Signed)
Message copied by Patience Musca on Wed Jun 09, 2011  5:04 PM ------      Message from: Roxy Manns A      Created: Thu Jun 03, 2011  8:46 AM       Labs ok -- chol is well controlled on med and diet       Please schedule 30 minute check up (PE) in nov with labs prior

## 2011-06-09 NOTE — Telephone Encounter (Signed)
Left vm for pt to callback 

## 2011-06-10 NOTE — Telephone Encounter (Signed)
Left vm for pt to callback 

## 2011-06-10 NOTE — Telephone Encounter (Signed)
Patient notified by Jacki Cones as instructed by telephone. Pt will call back for appt.

## 2011-09-09 ENCOUNTER — Other Ambulatory Visit: Payer: Self-pay | Admitting: Family Medicine

## 2011-11-08 ENCOUNTER — Other Ambulatory Visit: Payer: Self-pay | Admitting: Family Medicine

## 2011-11-16 ENCOUNTER — Ambulatory Visit: Payer: BC Managed Care – PPO

## 2011-11-30 ENCOUNTER — Ambulatory Visit (INDEPENDENT_AMBULATORY_CARE_PROVIDER_SITE_OTHER): Payer: Medicare Other

## 2011-11-30 DIAGNOSIS — Z23 Encounter for immunization: Secondary | ICD-10-CM

## 2011-12-08 ENCOUNTER — Other Ambulatory Visit: Payer: Self-pay | Admitting: Family Medicine

## 2012-01-04 ENCOUNTER — Telehealth: Payer: Self-pay | Admitting: Family Medicine

## 2012-01-04 DIAGNOSIS — K219 Gastro-esophageal reflux disease without esophagitis: Secondary | ICD-10-CM

## 2012-01-04 DIAGNOSIS — M899 Disorder of bone, unspecified: Secondary | ICD-10-CM

## 2012-01-04 DIAGNOSIS — E785 Hyperlipidemia, unspecified: Secondary | ICD-10-CM

## 2012-01-04 NOTE — Telephone Encounter (Signed)
Message copied by Judy Pimple on Tue Jan 04, 2012  8:14 PM ------      Message from: Baldomero Lamy      Created: Thu Dec 30, 2011 10:42 AM      Regarding: Cpx labs Wed  1/23       Please order  future cpx labs for pt's upcomming lab appt.      Thanks      Rodney Booze

## 2012-01-05 ENCOUNTER — Other Ambulatory Visit (INDEPENDENT_AMBULATORY_CARE_PROVIDER_SITE_OTHER): Payer: Medicare Other

## 2012-01-05 DIAGNOSIS — M899 Disorder of bone, unspecified: Secondary | ICD-10-CM

## 2012-01-05 DIAGNOSIS — E785 Hyperlipidemia, unspecified: Secondary | ICD-10-CM

## 2012-01-05 DIAGNOSIS — K219 Gastro-esophageal reflux disease without esophagitis: Secondary | ICD-10-CM

## 2012-01-05 LAB — COMPREHENSIVE METABOLIC PANEL
ALT: 21 U/L (ref 0–35)
Albumin: 4.4 g/dL (ref 3.5–5.2)
CO2: 31 mEq/L (ref 19–32)
GFR: 67.95 mL/min (ref >60.00)
Glucose, Bld: 88 mg/dL (ref 70–99)
Potassium: 4.2 mEq/L (ref 3.5–5.1)
Sodium: 141 mEq/L (ref 135–145)
Total Protein: 6.8 g/dL (ref 6.0–8.3)

## 2012-01-05 LAB — LIPID PANEL
Cholesterol: 134 mg/dL (ref 0–200)
LDL Cholesterol: 65 mg/dL (ref 0–99)
VLDL: 11.4 mg/dL (ref 0.0–40.0)

## 2012-01-05 LAB — CBC WITH DIFFERENTIAL/PLATELET
Basophils Absolute: 0 10*3/uL (ref 0.0–0.1)
Basophils Relative: 0.6 % (ref 0.0–3.0)
Eosinophils Absolute: 0.1 10*3/uL (ref 0.0–0.7)
Hemoglobin: 13 g/dL (ref 12.0–15.0)
Lymphs Abs: 1.3 10*3/uL (ref 0.7–4.0)
MCHC: 34.4 g/dL (ref 30.0–36.0)
MCV: 95.5 fl (ref 78.0–100.0)
Monocytes Absolute: 0.3 10*3/uL (ref 0.1–1.0)
Neutro Abs: 2.5 10*3/uL (ref 1.4–7.7)
RBC: 3.97 Mil/uL (ref 3.87–5.11)

## 2012-01-05 LAB — TSH: TSH: 2.3 u[IU]/mL (ref 0.35–5.50)

## 2012-01-11 ENCOUNTER — Ambulatory Visit (INDEPENDENT_AMBULATORY_CARE_PROVIDER_SITE_OTHER): Payer: Medicare Other | Admitting: Family Medicine

## 2012-01-11 ENCOUNTER — Encounter: Payer: Self-pay | Admitting: Family Medicine

## 2012-01-11 VITALS — BP 118/72 | HR 80 | Temp 98.2°F | Ht 63.75 in | Wt 138.5 lb

## 2012-01-11 DIAGNOSIS — Z23 Encounter for immunization: Secondary | ICD-10-CM

## 2012-01-11 DIAGNOSIS — N952 Postmenopausal atrophic vaginitis: Secondary | ICD-10-CM

## 2012-01-11 DIAGNOSIS — M949 Disorder of cartilage, unspecified: Secondary | ICD-10-CM

## 2012-01-11 DIAGNOSIS — K219 Gastro-esophageal reflux disease without esophagitis: Secondary | ICD-10-CM

## 2012-01-11 DIAGNOSIS — J309 Allergic rhinitis, unspecified: Secondary | ICD-10-CM

## 2012-01-11 DIAGNOSIS — E785 Hyperlipidemia, unspecified: Secondary | ICD-10-CM

## 2012-01-11 DIAGNOSIS — M899 Disorder of bone, unspecified: Secondary | ICD-10-CM

## 2012-01-11 MED ORDER — SIMVASTATIN 20 MG PO TABS: 20.0000 mg | ORAL_TABLET | Freq: Every day | ORAL | Status: DC

## 2012-01-11 MED ORDER — MOMETASONE FUROATE 50 MCG/ACT NA SUSP: 2.0000 | Freq: Every day | NASAL | Status: DC

## 2012-01-11 MED ORDER — ESTROGENS, CONJUGATED 0.625 MG/GM VA CREA: TOPICAL_CREAM | Freq: Every day | VAGINAL | Status: AC

## 2012-01-11 NOTE — Patient Instructions (Addendum)
We will schedule bone density test at check out  You should be due for a colonoscopy in july Pneumonia vaccine today  Increase vitamin D to 2000 iu daily  I sent px to your pharmacy  Keep up the good work

## 2012-01-11 NOTE — Assessment & Plan Note (Signed)
sched dexa Rev ca and D- will inc D to 2000 iu daily  Rev exercise

## 2012-01-11 NOTE — Progress Notes (Signed)
Subjective:    Patient ID: Kylie Acosta, female    DOB: Sep 23, 1946, 66 y.o.   MRN: [id number]  HPI Here for check up of chronic medical conditions and to review health mt list   She is doing well overall -- feels the best she has ever felt and in good shape   Wants to start using vaginal cream again for atrophy - to help lubrication  Needs refil of nasonex   Vit D is taking 1000 iu daily  Level is 45    bp good 118/72 Wt up 2 lb with bmi of 23  mammo 1/12- is scheduled for next week  Self exam no lumps or changes   Pap 10/09 Has had hysterectomy No symptoms or problems or new partners or abn paps   Colonoscopy 7/03  Has had neg stool card since   dexa 7/08 - worsened osteopenia  Had it scheduled and had to cancel Intol of fosamax (GI) and evista (hot flashes0  Pneumovax- needs that    Labs overall ok  Lab Results  Component Value Date   CHOL 134 01/05/2012   CHOL 178 06/02/2011   CHOL 179 12/02/2010   Lab Results  Component Value Date   HDL 57.50 01/05/2012   HDL 78.29 06/02/2011   HDL 56.21 12/02/2010   Lab Results  Component Value Date   LDLCALC 65 01/05/2012   LDLCALC 97 06/02/2011   LDLCALC 98 12/02/2010   Lab Results  Component Value Date   TRIG 57.0 01/05/2012   TRIG 82.0 06/02/2011   TRIG 60.0 12/02/2010   Lab Results  Component Value Date   CHOLHDL 2 01/05/2012   CHOLHDL 3 06/02/2011   CHOLHDL 3 12/02/2010   Lab Results  Component Value Date   LDLDIRECT 151.5 09/01/2010   on zocor and diet well controlled She started with fast on jan 1st -- - the day before her labs (fruit and veg and water for the one day before) -- some beans   Stays very active   Other labs ok     Chemistry      Component Value Date/Time   NA 141 01/05/2012 0815   K 4.2 01/05/2012 0815   CL 102 01/05/2012 0815   CO2 31 01/05/2012 0815   BUN 17 01/05/2012 0815   CREATININE 0.9 01/05/2012 0815      Component Value Date/Time   CALCIUM 9.3 01/05/2012 0815   ALKPHOS 54  01/05/2012 0815   AST 26 01/05/2012 0815   ALT 21 01/05/2012 0815   BILITOT 1.1 01/05/2012 0815       Acid reflux on nexium= does well on that - and does not need it every day  Patient Active Problem List  Diagnoses  . HYPERLIPIDEMIA  . ALLERGIC RHINITIS  . GERD  . VAGINITIS, ATROPHIC  . ECZEMA  . OSTEOPENIA   Past Medical History  Diagnosis Date  . Allergy     allerlgic rhinitis  . Hyperlipidemia   . Osteopenia    Past Surgical History  Procedure Date  . Abdominal hysterectomy 1997    fibroids   History  Substance Use Topics  . Smoking status: Never Smoker   . Smokeless tobacco: Not on file  . Alcohol Use: Not on file   No family history on file. Allergies  Allergen Reactions  . Alendronate Sodium     REACTION: gerd  . Atorvastatin     REACTION: muscle and joint pain  . Famotidine     REACTION: not effective  .  Omeprazole     REACTION: not effective  . Raloxifene     REACTION: hot flashes  . Sulfonamide Derivatives     REACTION: whelps   Current Outpatient Prescriptions on File Prior to Visit  Medication Sig Dispense Refill  . NEXIUM 40 MG capsule TAKE ONE CAPSULE BY MOUTH EVERY MORNING 30 MINUTES BEFORE BREAKFAST  30 capsule  0         Review of Systems Review of Systems  Constitutional: Negative for fever, appetite change, fatigue and unexpected weight change.  Eyes: Negative for pain and visual disturbance.  Respiratory: Negative for cough and shortness of breath.   Cardiovascular: Negative for cp or palpitations    Gastrointestinal: Negative for nausea, diarrhea and constipation.  Genitourinary: Negative for urgency and frequency.  Skin: Negative for pallor or rash   Neurological: Negative for weakness, light-headedness, numbness and headaches.  Hematological: Negative for adenopathy. Does not bruise/bleed easily.  Psychiatric/Behavioral: Negative for dysphoric mood. The patient is not nervous/anxious.          Objective:   Physical Exam    Constitutional: She appears well-developed and well-nourished. No distress.  HENT:  Head: Normocephalic and atraumatic.  Right Ear: External ear normal.  Left Ear: External ear normal.  Nose: Nose normal.  Mouth/Throat: Oropharynx is clear and moist.  Eyes: Conjunctivae and EOM are normal. Pupils are equal, round, and reactive to light. No scleral icterus.  Neck: Normal range of motion. Neck supple. No JVD present. Carotid bruit is not present. No thyromegaly present.  Cardiovascular: Normal rate, regular rhythm, normal heart sounds and intact distal pulses.  Exam reveals no gallop.   Pulmonary/Chest: Effort normal and breath sounds normal. No respiratory distress. She has no wheezes.  Abdominal: Soft. Bowel sounds are normal. She exhibits no distension and no mass. There is no tenderness.  Genitourinary: No breast swelling, tenderness, discharge or bleeding.       Breast exam: No mass, nodules, thickening, tenderness, bulging, retraction, inflamation, nipple discharge or skin changes noted.  No axillary or clavicular LA.  Chaperoned exam.    Musculoskeletal: Normal range of motion. She exhibits no edema and no tenderness.  Lymphadenopathy:    She has no cervical adenopathy.  Neurological: She is alert. She has normal reflexes. No cranial nerve deficit. She exhibits normal muscle tone. Coordination normal.  Skin: Skin is warm and dry. No rash noted. No erythema. No pallor.  Psychiatric: She has a normal mood and affect.          Assessment & Plan:

## 2012-01-11 NOTE — Assessment & Plan Note (Signed)
Urged to come off the afrin due to habit forming potential  Sent in px for nasonex

## 2012-01-11 NOTE — Assessment & Plan Note (Signed)
Very good control with diet and exercise and zocor

## 2012-01-11 NOTE — Assessment & Plan Note (Signed)
Doing much better - does not have to take nexium daily Good diet

## 2012-01-11 NOTE — Assessment & Plan Note (Signed)
Refilled premarin cream for use twice weekly

## 2012-01-19 LAB — HM MAMMOGRAPHY: HM Mammogram: NORMAL

## 2012-01-19 LAB — HM DEXA SCAN

## 2012-01-20 ENCOUNTER — Encounter: Payer: Self-pay | Admitting: Family Medicine

## 2012-01-26 ENCOUNTER — Encounter: Payer: Self-pay | Admitting: Family Medicine

## 2012-05-16 ENCOUNTER — Ambulatory Visit (INDEPENDENT_AMBULATORY_CARE_PROVIDER_SITE_OTHER): Payer: Medicare Other | Admitting: Family Medicine

## 2012-05-16 ENCOUNTER — Encounter: Payer: Self-pay | Admitting: Family Medicine

## 2012-05-16 VITALS — BP 124/78 | HR 71 | Temp 98.0°F | Ht 64.75 in | Wt 136.2 lb

## 2012-05-16 DIAGNOSIS — J209 Acute bronchitis, unspecified: Secondary | ICD-10-CM

## 2012-05-16 MED ORDER — GUAIFENESIN-CODEINE 100-10 MG/5ML PO SYRP: 5.0000 mL | ORAL_SOLUTION | Freq: Four times a day (QID) | ORAL | Status: AC | PRN

## 2012-05-16 MED ORDER — AZITHROMYCIN 250 MG PO TABS: ORAL_TABLET | ORAL | Status: AC

## 2012-05-16 NOTE — Assessment & Plan Note (Signed)
2 week duration- after overseas trip  No reactive airways  Cover with zithromax Robitussin with codeine for cough prn  Update if not starting to improve in a week or if worsening

## 2012-05-16 NOTE — Patient Instructions (Signed)
I think you have bronchitis  Drink lots of fluids and get rest  Take the zithromax as directed  Use the cough syrup with caution (sedation) Update if not starting to improve in a week or if worsening

## 2012-05-16 NOTE — Progress Notes (Signed)
Subjective:    Patient ID: Kylie Acosta, female    DOB: September 27, 1946, 66 y.o.   MRN: [id number]  HPI Here for uri/ bronchitis symptoms  Started symptoms at the end of may  Started as chest congestion and cough  Cough-non prod and st  Chest is raw and tight Coughs all night -cannot sleep  Tried some claritin - not help  Tried whiskey one night     Was on overseas flight - exp to sick people   Patient Active Problem List  Diagnoses  . HYPERLIPIDEMIA  . ALLERGIC RHINITIS  . GERD  . VAGINITIS, ATROPHIC  . ECZEMA  . OSTEOPENIA  . Acute bronchitis   Past Medical History  Diagnosis Date  . Allergy     allerlgic rhinitis  . Hyperlipidemia   . Osteopenia    Past Surgical History  Procedure Date  . Abdominal hysterectomy 1997    fibroids   History  Substance Use Topics  . Smoking status: Never Smoker   . Smokeless tobacco: Not on file  . Alcohol Use: Not on file   No family history on file. Allergies  Allergen Reactions  . Alendronate Sodium     REACTION: gerd  . Atorvastatin     REACTION: muscle and joint pain  . Famotidine     REACTION: not effective  . Omeprazole     REACTION: not effective  . Raloxifene     REACTION: hot flashes  . Sulfonamide Derivatives     REACTION: whelps   Current Outpatient Prescriptions on File Prior to Visit  Medication Sig Dispense Refill  . cholecalciferol (VITAMIN D) 1000 UNITS tablet Take 2,000 Units by mouth daily.       Marland Kitchen conjugated estrogens (PREMARIN) vaginal cream Place vaginally daily. Insert a small amount (1-2 cm) vaginally twice a week  42.5 g  3  . esomeprazole (NEXIUM) 40 MG capsule       . mometasone (NASONEX) 50 MCG/ACT nasal spray Place 2 sprays into the nose daily.  51 g  3  . simvastatin (ZOCOR) 20 MG tablet Take 1 tablet (20 mg total) by mouth daily.  90 tablet  3  . tobramycin-dexamethasone (TOBRADEX) ophthalmic solution Place 1 drop into both eyes 2 (two) times daily as needed.      Marland Kitchen atovaquone-proguanil  (MALARONE) 250-100 MG TABS Take 1 tablet by mouth daily as needed.          Review of Systems Review of Systems  Constitutional: Negative for fever, appetite change, fatigue and unexpected weight change.  Eyes: Negative for pain and visual disturbance.  ENT pos for post nasal drip/ neg for sinus pain Respiratory: Negative for sob or wheeze Cardiovascular: Negative for cp or palpitations    Gastrointestinal: Negative for nausea, diarrhea and constipation.  Genitourinary: Negative for urgency and frequency.  Skin: Negative for pallor or rash   Neurological: Negative for weakness, light-headedness, numbness and headaches.  Hematological: Negative for adenopathy. Does not bruise/bleed easily.  Psychiatric/Behavioral: Negative for dysphoric mood. The patient is not nervous/anxious.         Objective:   Physical Exam  Constitutional: She appears well-developed and well-nourished. No distress.  HENT:  Head: Normocephalic and atraumatic.  Right Ear: External ear normal.  Left Ear: External ear normal.  Mouth/Throat: Oropharynx is clear and moist.       Nares are boggy but clear No sinus tenderness  Eyes: Conjunctivae and EOM are normal. Pupils are equal, round, and reactive to light. Right eye exhibits  no discharge. Left eye exhibits no discharge. No scleral icterus.  Neck: Normal range of motion. Neck supple. No JVD present. Carotid bruit is not present. No thyromegaly present.  Cardiovascular: Normal rate, regular rhythm, normal heart sounds and intact distal pulses.  Exam reveals no gallop.   Pulmonary/Chest: Effort normal and breath sounds normal. No respiratory distress. She has no wheezes. She has no rales.       Harsh bs at bases  occ / rare rhonchi No wheeze  Abdominal: Soft. Bowel sounds are normal. She exhibits no distension.  Musculoskeletal: Normal range of motion. She exhibits no edema and no tenderness.  Lymphadenopathy:    She has no cervical adenopathy.  Neurological:  She is alert. She has normal reflexes.  Skin: Skin is warm and dry. No rash noted.  Psychiatric: She has a normal mood and affect.          Assessment & Plan:

## 2012-11-07 ENCOUNTER — Ambulatory Visit (INDEPENDENT_AMBULATORY_CARE_PROVIDER_SITE_OTHER): Payer: Medicare Other | Admitting: Family Medicine

## 2012-11-07 ENCOUNTER — Encounter: Payer: Self-pay | Admitting: Family Medicine

## 2012-11-07 VITALS — BP 124/72 | HR 76 | Temp 98.2°F | Resp 20 | Ht 64.75 in | Wt 136.8 lb

## 2012-11-07 DIAGNOSIS — J019 Acute sinusitis, unspecified: Secondary | ICD-10-CM

## 2012-11-07 MED ORDER — AMOXICILLIN-POT CLAVULANATE 875-125 MG PO TABS: 1.0000 | ORAL_TABLET | Freq: Two times a day (BID) | ORAL | Status: DC

## 2012-11-07 MED ORDER — GUAIFENESIN-CODEINE 100-10 MG/5ML PO SYRP: 5.0000 mL | ORAL_SOLUTION | Freq: Four times a day (QID) | ORAL | Status: DC | PRN

## 2012-11-07 NOTE — Progress Notes (Signed)
Subjective:    Patient ID: Kylie Acosta, female    DOB: [birth year], 66 y.o.   MRN: [id number]  HPI Here with uri symptoms -- over 1 week  Started with nasal symptoms Now has moved to her chest -- night time cough is bad  Loosing voice   Has had sinus pain and pressure- worse on the L  Ears are full  Green nasal discharge with some blood  No fever   Some prod of green mucous with cough also  No wheeze or trouble breathing   Patient Active Problem List  Diagnosis  . HYPERLIPIDEMIA  . ALLERGIC RHINITIS  . GERD  . VAGINITIS, ATROPHIC  . ECZEMA  . OSTEOPENIA  . Acute bronchitis   Past Medical History  Diagnosis Date  . Allergy     allerlgic rhinitis  . Hyperlipidemia   . Osteopenia    Past Surgical History  Procedure Date  . Abdominal hysterectomy 1997    fibroids   History  Substance Use Topics  . Smoking status: Never Smoker   . Smokeless tobacco: Not on file  . Alcohol Use: Not on file   No family history on file. Allergies  Allergen Reactions  . Alendronate Sodium     REACTION: gerd  . Atorvastatin     REACTION: muscle and joint pain  . Famotidine     REACTION: not effective  . Omeprazole     REACTION: not effective  . Raloxifene     REACTION: hot flashes  . Sulfonamide Derivatives     REACTION: whelps   Current Outpatient Prescriptions on File Prior to Visit  Medication Sig Dispense Refill  . cholecalciferol (VITAMIN D) 1000 UNITS tablet Take 2,000 Units by mouth daily.       Marland Kitchen conjugated estrogens (PREMARIN) vaginal cream Place vaginally daily. Insert a small amount (1-2 cm) vaginally twice a week  42.5 g  3  . simvastatin (ZOCOR) 20 MG tablet Take 1 tablet (20 mg total) by mouth daily.  90 tablet  3  . atovaquone-proguanil (MALARONE) 250-100 MG TABS Take 1 tablet by mouth daily as needed.      Marland Kitchen esomeprazole (NEXIUM) 40 MG capsule       . mometasone (NASONEX) 50 MCG/ACT nasal spray Place 2 sprays into the nose daily.  51 g  3  . Multiple  Vitamin (MULTIVITAMIN) tablet Take 1 tablet by mouth daily.      Marland Kitchen tobramycin-dexamethasone (TOBRADEX) ophthalmic solution Place 1 drop into both eyes 2 (two) times daily as needed.              Review of Systems Review of Systems  Constitutional: Negative for fever, appetite change, and unexpected weight change.  ENT pos for cong and post nasal drip and sinus pain  Eyes: Negative for pain and visual disturbance.  Respiratory: Negative for wheeze  and shortness of breath.   Cardiovascular: Negative for cp or palpitations    Gastrointestinal: Negative for nausea, diarrhea and constipation.  Genitourinary: Negative for urgency and frequency.  Skin: Negative for pallor or rash   Neurological: Negative for weakness, light-headedness, numbness and headaches.  Hematological: Negative for adenopathy. Does not bruise/bleed easily.  Psychiatric/Behavioral: Negative for dysphoric mood. The patient is not nervous/anxious.         Objective:   Physical Exam  Constitutional: She appears well-developed and well-nourished. No distress.  HENT:  Head: Normocephalic and atraumatic.  Right Ear: External ear normal.  Left Ear: External ear normal.  Mouth/Throat: Oropharynx is  clear and moist. No oropharyngeal exudate.       Nares are injected and congested   Sinus tenderness worse on the L maxillary   Eyes: Conjunctivae normal and EOM are normal. Pupils are equal, round, and reactive to light. Right eye exhibits no discharge. Left eye exhibits no discharge.  Neck: Normal range of motion. Neck supple. No JVD present. Carotid bruit is not present. No thyromegaly present.  Cardiovascular: Normal rate, regular rhythm, normal heart sounds and intact distal pulses.  Exam reveals no gallop.   Pulmonary/Chest: Effort normal and breath sounds normal. No respiratory distress. She has no wheezes.  Musculoskeletal: She exhibits no edema.  Lymphadenopathy:    She has no cervical adenopathy.  Neurological:  She is alert. She has normal reflexes. No cranial nerve deficit.  Skin: Skin is warm and dry. No rash noted. No erythema.  Psychiatric: She has a normal mood and affect.          Assessment & Plan:

## 2012-11-07 NOTE — Patient Instructions (Addendum)
Drink lots of fluids Try to get extra rest Wash hands frequently  Take augmentin as directed  Use cough syrup with caution -it can sedate Update if not starting to improve in a week or if worsening

## 2012-11-10 NOTE — Assessment & Plan Note (Signed)
tx with augmentin and tx symptoms with robitussin ac  Disc symptomatic care - see instructions on AVS  Update if not starting to improve in a week or if worsening

## 2012-11-29 ENCOUNTER — Ambulatory Visit (INDEPENDENT_AMBULATORY_CARE_PROVIDER_SITE_OTHER): Payer: Medicare Other

## 2012-11-29 DIAGNOSIS — Z23 Encounter for immunization: Secondary | ICD-10-CM

## 2013-02-02 ENCOUNTER — Other Ambulatory Visit: Payer: Self-pay

## 2013-02-02 MED ORDER — SIMVASTATIN 20 MG PO TABS: 20.0000 mg | ORAL_TABLET | Freq: Every day | ORAL | Status: DC

## 2013-02-02 NOTE — Telephone Encounter (Signed)
Pt left v/m requesting refill simvastatin to [facility]. # [phone]. Pt already scheduled CPX 04/02/13.unable to reach pt by phone; notified Simonne Maffucci refill sent in.

## 2013-02-19 ENCOUNTER — Encounter: Payer: Self-pay | Admitting: Family Medicine

## 2013-02-20 ENCOUNTER — Encounter: Payer: Self-pay | Admitting: *Deleted

## 2013-04-02 ENCOUNTER — Encounter: Payer: Medicare Other | Admitting: Family Medicine

## 2013-04-06 ENCOUNTER — Ambulatory Visit (INDEPENDENT_AMBULATORY_CARE_PROVIDER_SITE_OTHER): Payer: Medicare Other | Admitting: Family Medicine

## 2013-04-06 ENCOUNTER — Encounter: Payer: Self-pay | Admitting: Family Medicine

## 2013-04-06 VITALS — BP 118/62 | HR 74 | Temp 98.2°F | Ht 63.75 in | Wt 135.8 lb

## 2013-04-06 DIAGNOSIS — Z1211 Encounter for screening for malignant neoplasm of colon: Secondary | ICD-10-CM | POA: Insufficient documentation

## 2013-04-06 DIAGNOSIS — M899 Disorder of bone, unspecified: Secondary | ICD-10-CM

## 2013-04-06 DIAGNOSIS — Z23 Encounter for immunization: Secondary | ICD-10-CM

## 2013-04-06 DIAGNOSIS — E785 Hyperlipidemia, unspecified: Secondary | ICD-10-CM

## 2013-04-06 DIAGNOSIS — M949 Disorder of cartilage, unspecified: Secondary | ICD-10-CM

## 2013-04-06 DIAGNOSIS — Z0000 Encounter for general adult medical examination without abnormal findings: Secondary | ICD-10-CM | POA: Insufficient documentation

## 2013-04-06 NOTE — Progress Notes (Signed)
Subjective:    Patient ID: Kylie Acosta, female    DOB: [birth year], 67 y.o.   MRN: [id number]  HPI I have personally reviewed the Medicare Annual Wellness questionnaire and have noted 1. The patient's medical and social history 2. Their use of alcohol, tobacco or illicit drugs 3. Their current medications and supplements 4. The patient's functional ability including ADL's, fall risks, home safety risks and hearing or visual             impairment. 5. Diet and physical activities 6. Evidence for depression or mood disorders  The patients weight, height, BMI have been recorded in the chart and visual acuity is per eye clinic.  I have made referrals, counseling and provided education to the patient based review of the above and I have provided the pt with a written personalized care plan for preventive services.  Is doing well overall   See scanned forms.  Routine anticipatory guidance given to patient.  See health maintenance. Flu shot was 12/13 Shingles shot 8/08 PNA was 1/13 Tetanus was 11/04-- will get that today Colonosopy was 7/03-is due for a recall 10 years - with her last colonoscopy she had trouble coming out of the anesthesia  She also had a lot of pain afterwards  Has P aunt with colon cancer  Breast cancer screening mammogram 3/14 Self exam- no breast lumps  Advance directive- does not have living will or power of attorney - will do that  Cognitive function addressed- see scanned forms- and if abnormal then additional documentation follows.  No worries whatsoever from she or her family    PMH and SH reviewed  Meds, vitals, and allergies reviewed.   Mood-has been good / considers herself fortunate / no depression / and stays motivated   Falls- none at all - balance is good    dexa 2/13 osteopenia stable Ca and D- gets a good amt of ca from diet and she takes her vitamin D  Fractures- none   Pap 09 Had hysterectomy  No gyn symptoms   Due for labs- today    Hyperlipidemia - due for lab - but will come back fasting  She is watching diet dor sat fats closely  Patient Active Problem List  Diagnosis  . HYPERLIPIDEMIA  . ALLERGIC RHINITIS  . GERD  . VAGINITIS, ATROPHIC  . ECZEMA  . OSTEOPENIA  . Encounter for Medicare annual wellness exam  . Colon cancer screening   Past Medical History  Diagnosis Date  . Allergy     allerlgic rhinitis  . Hyperlipidemia   . Osteopenia    Past Surgical History  Procedure Laterality Date  . Abdominal hysterectomy  1997    fibroids   History  Substance Use Topics  . Smoking status: Never Smoker   . Smokeless tobacco: Not on file  . Alcohol Use: No   No family history on file. Allergies  Allergen Reactions  . Alendronate Sodium     REACTION: gerd  . Atorvastatin     REACTION: muscle and joint pain  . Famotidine     REACTION: not effective  . Omeprazole     REACTION: not effective  . Raloxifene     REACTION: hot flashes  . Sulfonamide Derivatives     REACTION: whelps   Current Outpatient Prescriptions on File Prior to Visit  Medication Sig Dispense Refill  . atovaquone-proguanil (MALARONE) 250-100 MG TABS Take 1 tablet by mouth daily as needed.      . cholecalciferol (VITAMIN  D) 1000 UNITS tablet Take 2,000 Units by mouth daily.       . simvastatin (ZOCOR) 20 MG tablet Take 1 tablet (20 mg total) by mouth daily.  90 tablet  0  . tobramycin-dexamethasone (TOBRADEX) ophthalmic solution Place 1 drop into both eyes 2 (two) times daily as needed.       No current facility-administered medications on file prior to visit.    Review of Systems Review of Systems  Constitutional: Negative for fever, appetite change, fatigue and unexpected weight change.  Eyes: Negative for pain and visual disturbance.  Respiratory: Negative for cough and shortness of breath.   Cardiovascular: Negative for cp or palpitations    Gastrointestinal: Negative for nausea, diarrhea and constipation.   Genitourinary: Negative for urgency and frequency.  Skin: Negative for pallor or rash   Neurological: Negative for weakness, light-headedness, numbness and headaches.  Hematological: Negative for adenopathy. Does not bruise/bleed easily.  Psychiatric/Behavioral: Negative for dysphoric mood. The patient is not nervous/anxious.         Objective:   Physical Exam  Constitutional: She appears well-developed and well-nourished. No distress.  HENT:  Head: Normocephalic and atraumatic.  Right Ear: External ear normal.  Left Ear: External ear normal.  Nose: Nose normal.  Mouth/Throat: Oropharynx is clear and moist.  Eyes: Conjunctivae and EOM are normal. Pupils are equal, round, and reactive to light. Right eye exhibits no discharge. Left eye exhibits no discharge. No scleral icterus.  Neck: Normal range of motion. Neck supple. No JVD present. Carotid bruit is not present. No thyromegaly present.  Cardiovascular: Normal rate, regular rhythm, normal heart sounds and intact distal pulses.  Exam reveals no gallop.   Pulmonary/Chest: Effort normal and breath sounds normal. No respiratory distress. She has no wheezes. She has no rales.  Abdominal: Soft. Bowel sounds are normal. She exhibits no distension, no abdominal bruit and no mass. There is no tenderness.  Genitourinary: No breast swelling, tenderness, discharge or bleeding.  Breast exam: No mass, nodules, thickening, tenderness, bulging, retraction, inflamation, nipple discharge or skin changes noted.  No axillary or clavicular LA.  Chaperoned exam.    Musculoskeletal: She exhibits no edema and no tenderness.  Lymphadenopathy:    She has no cervical adenopathy.  Neurological: She is alert. She has normal reflexes. No cranial nerve deficit. She exhibits normal muscle tone. Coordination normal.  Skin: Skin is warm and dry. No rash noted. No erythema. No pallor.  Psychiatric: She has a normal mood and affect.          Assessment & Plan:

## 2013-04-06 NOTE — Patient Instructions (Addendum)
Please do stool card for colon cancer screening  Keep up the good work with healthy lifestyle  Tetanus shot today  Schedule fasting labs at check out

## 2013-04-08 NOTE — Assessment & Plan Note (Signed)
Due for check On zocor and healthy diet Disc goals for lipids and reasons to control them Rev low sat fat diet in detail

## 2013-04-08 NOTE — Assessment & Plan Note (Signed)
Up to date on dexa  On ca and D  No fractures or falls Disc imp of exercise and safety

## 2013-04-08 NOTE — Assessment & Plan Note (Signed)
Reviewed health habits including diet and exercise and skin cancer prevention Also reviewed health mt list, fam hx and immunizations  See HPI Fasting labs were scheduled  Overall commended on great self care  Disc adv directive

## 2013-04-08 NOTE — Assessment & Plan Note (Signed)
D/w [name] for colon cancer screening, including IFOB vs. colonoscopy.  Risks and benefits of both were discussed and patient voiced understanding.  Pt elects for:  Will do IFOB for now but may elect for colonoscopy later  No symptoms

## 2013-04-11 ENCOUNTER — Other Ambulatory Visit (INDEPENDENT_AMBULATORY_CARE_PROVIDER_SITE_OTHER): Payer: Medicare Other

## 2013-04-11 DIAGNOSIS — E785 Hyperlipidemia, unspecified: Secondary | ICD-10-CM

## 2013-04-11 DIAGNOSIS — M899 Disorder of bone, unspecified: Secondary | ICD-10-CM

## 2013-04-11 DIAGNOSIS — M949 Disorder of cartilage, unspecified: Secondary | ICD-10-CM

## 2013-04-11 DIAGNOSIS — Z0000 Encounter for general adult medical examination without abnormal findings: Secondary | ICD-10-CM

## 2013-04-11 LAB — CBC WITH DIFFERENTIAL/PLATELET
Basophils Relative: 0.7 % (ref 0.0–3.0)
Eosinophils Relative: 6.4 % — ABNORMAL HIGH (ref 0.0–5.0)
HCT: 39.6 % (ref 36.0–46.0)
Hemoglobin: 13.8 g/dL (ref 12.0–15.0)
Lymphs Abs: 1.3 10*3/uL (ref 0.7–4.0)
MCV: 93.3 fl (ref 78.0–100.0)
Monocytes Absolute: 0.4 10*3/uL (ref 0.1–1.0)
Monocytes Relative: 7.5 % (ref 3.0–12.0)
RBC: 4.24 Mil/uL (ref 3.87–5.11)
WBC: 5.1 10*3/uL (ref 4.5–10.5)

## 2013-04-11 LAB — LIPID PANEL
LDL Cholesterol: 73 mg/dL (ref 0–99)
Total CHOL/HDL Ratio: 2
VLDL: 10.6 mg/dL (ref 0.0–40.0)

## 2013-04-12 ENCOUNTER — Other Ambulatory Visit (INDEPENDENT_AMBULATORY_CARE_PROVIDER_SITE_OTHER): Payer: Medicare Other

## 2013-04-12 DIAGNOSIS — Z1211 Encounter for screening for malignant neoplasm of colon: Secondary | ICD-10-CM

## 2013-04-12 LAB — FECAL OCCULT BLOOD, IMMUNOCHEMICAL: Fecal Occult Bld: NEGATIVE

## 2013-04-12 LAB — VITAMIN D 25 HYDROXY (VIT D DEFICIENCY, FRACTURES): Vit D, 25-Hydroxy: 43 ng/mL (ref 30–89)

## 2013-04-13 ENCOUNTER — Encounter: Payer: Self-pay | Admitting: *Deleted

## 2013-04-18 ENCOUNTER — Encounter: Payer: Self-pay | Admitting: *Deleted

## 2013-04-18 NOTE — Telephone Encounter (Signed)
Encounter opened in error

## 2013-04-21 ENCOUNTER — Other Ambulatory Visit: Payer: Self-pay | Admitting: Family Medicine

## 2013-04-23 NOTE — Telephone Encounter (Signed)
Received refill request electronically See allergy/contraindication Is it okay to refill? 

## 2013-04-23 NOTE — Telephone Encounter (Signed)
Yes- please refil for 12 months-thanks

## 2013-11-07 ENCOUNTER — Ambulatory Visit (INDEPENDENT_AMBULATORY_CARE_PROVIDER_SITE_OTHER): Payer: Medicare Other

## 2013-11-07 DIAGNOSIS — Z23 Encounter for immunization: Secondary | ICD-10-CM

## 2014-02-26 ENCOUNTER — Telehealth: Payer: Self-pay | Admitting: Family Medicine

## 2014-02-26 NOTE — Telephone Encounter (Signed)
Pt called to schedule CPE and your next available is appointment is 07/29/14. Would you be able to accomodate [facility] for and earlier appt?

## 2014-02-26 NOTE — Telephone Encounter (Signed)
Please put her in a 30 min slot

## 2014-04-10 ENCOUNTER — Telehealth: Payer: Self-pay | Admitting: Family Medicine

## 2014-04-10 ENCOUNTER — Other Ambulatory Visit (INDEPENDENT_AMBULATORY_CARE_PROVIDER_SITE_OTHER): Payer: Medicare Other

## 2014-04-10 DIAGNOSIS — Z0000 Encounter for general adult medical examination without abnormal findings: Secondary | ICD-10-CM

## 2014-04-10 DIAGNOSIS — M949 Disorder of cartilage, unspecified: Principal | ICD-10-CM

## 2014-04-10 DIAGNOSIS — M899 Disorder of bone, unspecified: Secondary | ICD-10-CM

## 2014-04-10 DIAGNOSIS — E785 Hyperlipidemia, unspecified: Secondary | ICD-10-CM

## 2014-04-10 LAB — COMPREHENSIVE METABOLIC PANEL
ALT: 26 U/L (ref 0–35)
AST: 27 U/L (ref 0–37)
Albumin: 4.2 g/dL (ref 3.5–5.2)
Alkaline Phosphatase: 53 U/L (ref 39–117)
BILIRUBIN TOTAL: 0.9 mg/dL (ref 0.3–1.2)
BUN: 18 mg/dL (ref 6–23)
CALCIUM: 9.4 mg/dL (ref 8.4–10.5)
CHLORIDE: 104 meq/L (ref 96–112)
CO2: 32 meq/L (ref 19–32)
Creatinine, Ser: 0.7 mg/dL (ref 0.4–1.2)
GFR: 87.91 mL/min (ref >60.00)
Glucose, Bld: 88 mg/dL (ref 70–99)
Potassium: 4.2 mEq/L (ref 3.5–5.1)
Sodium: 141 mEq/L (ref 135–145)
Total Protein: 6.8 g/dL (ref 6.0–8.3)

## 2014-04-10 LAB — CBC WITH DIFFERENTIAL/PLATELET
BASOS PCT: 0.7 % (ref 0.0–3.0)
Basophils Absolute: 0 10*3/uL (ref 0.0–0.1)
Eosinophils Absolute: 0.2 10*3/uL (ref 0.0–0.7)
Eosinophils Relative: 3.3 % (ref 0.0–5.0)
HEMATOCRIT: 42 % (ref 36.0–46.0)
HEMOGLOBIN: 14 g/dL (ref 12.0–15.0)
LYMPHS PCT: 24.9 % (ref 12.0–46.0)
Lymphs Abs: 1.4 10*3/uL (ref 0.7–4.0)
MCHC: 33.4 g/dL (ref 30.0–36.0)
MCV: 95.5 fl (ref 78.0–100.0)
MONOS PCT: 6.7 % (ref 3.0–12.0)
Monocytes Absolute: 0.4 10*3/uL (ref 0.1–1.0)
Neutro Abs: 3.6 10*3/uL (ref 1.4–7.7)
Neutrophils Relative %: 64.4 % (ref 43.0–77.0)
Platelets: 255 10*3/uL (ref 150.0–400.0)
RBC: 4.39 Mil/uL (ref 3.87–5.11)
RDW: 12.5 % (ref 11.5–14.6)
WBC: 5.6 10*3/uL (ref 4.5–10.5)

## 2014-04-10 LAB — LIPID PANEL
CHOLESTEROL: 178 mg/dL (ref 0–200)
HDL: 73.5 mg/dL (ref >39.00)
LDL Cholesterol: 91 mg/dL (ref 0–99)
TRIGLYCERIDES: 67 mg/dL (ref 0.0–149.0)
Total CHOL/HDL Ratio: 2
VLDL: 13.4 mg/dL (ref 0.0–40.0)

## 2014-04-10 LAB — TSH: TSH: 3.19 u[IU]/mL (ref 0.35–5.50)

## 2014-04-10 NOTE — Telephone Encounter (Signed)
Message copied by Abner Greenspan on Wed Apr 10, 2014  1:02 AM ------      Message from: Ellamae Sia      Created: Tue Apr 02, 2014 10:59 AM      Regarding: Lab orders for Wednesday, 4.29.15       Patient is scheduled for CPX labs, please order future labs, Thanks , Terri       ------

## 2014-04-10 NOTE — Telephone Encounter (Signed)
Message copied by Abner Greenspan on Wed Apr 10, 2014 10:11 PM ------      Message from: Ellamae Sia      Created: Tue Apr 02, 2014 12:45 PM      Regarding: Lab orders for Thursday, 4.30.15       Patient is scheduled for CPX labs, please order future labs, Thanks , Terri       ------

## 2014-04-11 LAB — VITAMIN D 25 HYDROXY (VIT D DEFICIENCY, FRACTURES): VIT D 25 HYDROXY: 26 ng/mL — AB (ref 30–89)

## 2014-05-01 ENCOUNTER — Encounter: Payer: Self-pay | Admitting: Family Medicine

## 2014-05-01 ENCOUNTER — Ambulatory Visit (INDEPENDENT_AMBULATORY_CARE_PROVIDER_SITE_OTHER): Payer: Medicare Other | Admitting: Family Medicine

## 2014-05-01 VITALS — BP 122/76 | HR 63 | Temp 97.7°F | Ht 63.75 in | Wt 137.5 lb

## 2014-05-01 DIAGNOSIS — Z23 Encounter for immunization: Secondary | ICD-10-CM

## 2014-05-01 DIAGNOSIS — Z0000 Encounter for general adult medical examination without abnormal findings: Secondary | ICD-10-CM

## 2014-05-01 DIAGNOSIS — E785 Hyperlipidemia, unspecified: Secondary | ICD-10-CM

## 2014-05-01 DIAGNOSIS — M899 Disorder of bone, unspecified: Secondary | ICD-10-CM

## 2014-05-01 DIAGNOSIS — Z1211 Encounter for screening for malignant neoplasm of colon: Secondary | ICD-10-CM

## 2014-05-01 DIAGNOSIS — M949 Disorder of cartilage, unspecified: Secondary | ICD-10-CM

## 2014-05-01 NOTE — Patient Instructions (Signed)
Please do stool card for colon cancer screening Don't forget to schedule your mammogram  Prevnar vaccine today Work on a living will/advance directive  Get some vitamin D3 over the counter and take 2000 iu daily  Get out / be social and be active

## 2014-05-01 NOTE — Progress Notes (Signed)
Subjective:    Patient ID: Kylie Acosta, female    DOB: [birth year], 68 y.o.   MRN: [id number]  HPI I have personally reviewed the Medicare Annual Wellness questionnaire and have noted 1. The patient's medical and social history 2. Their use of alcohol, tobacco or illicit drugs 3. Their current medications and supplements 4. The patient's functional ability including ADL's, fall risks, home safety risks and hearing or visual             impairment. 5. Diet and physical activities 6. Evidence for depression or mood disorders  The patients weight, height, BMI have been recorded in the chart and visual acuity is per eye clinic.  I have made referrals, counseling and provided education to the patient based review of the above and I have provided the pt with a written personalized care plan for preventive services.  Wt is up 2 lb with bmi of 23  Has been doing well - very well  Mood is good and no falls  She goes to derm now and then for non cancerous lesions to remove them    See scanned forms.  Routine anticipatory guidance given to patient.  See health maintenance. Colon cancer screening colonosc 7/03, and ifob neg 5/14  Breast cancer screening 3/14 mammogram- has not called yet  Self breast exam -no lumps or changes  No gyn symptoms /has had a supracervical hysterectomy in the past  Flu vaccine 11/14  Tetanus vaccine  4/14 Pneumovax 1/13 - she is interested in the prevnar  Zoster vaccine 11/08  Advance directive- does not have a living will  Cognitive function addressed- see scanned forms- and if abnormal then additional documentation follows. No worries about her memory -occ walks in a room and forgets why   PMH and SH reviewed  Meds, vitals, and allergies reviewed.   ROS: See HPI.  Otherwise negative.    Osteopenia stable in 2/13 dexa D level is 23- taking very little vit D No falls or fx  Intol of fosamax and evista   Results for orders placed in visit on 04/10/14    TSH      Result Value Ref Range   TSH 3.19  0.35 - 5.50 uIU/mL  VITAMIN D 25 HYDROXY      Result Value Ref Range   Vit D, 25-Hydroxy 26 (*) 30 - 89 ng/mL  CBC WITH DIFFERENTIAL      Result Value Ref Range   WBC 5.6  4.5 - 10.5 K/uL   RBC 4.39  3.87 - 5.11 Mil/uL   Hemoglobin 14.0  12.0 - 15.0 g/dL   HCT 42.0  36.0 - 46.0 %   MCV 95.5  78.0 - 100.0 fl   MCHC 33.4  30.0 - 36.0 g/dL   RDW 12.5  11.5 - 14.6 %   Platelets 255.0  150.0 - 400.0 K/uL   Neutrophils Relative % 64.4  43.0 - 77.0 %   Lymphocytes Relative 24.9  12.0 - 46.0 %   Monocytes Relative 6.7  3.0 - 12.0 %   Eosinophils Relative 3.3  0.0 - 5.0 %   Basophils Relative 0.7  0.0 - 3.0 %   Neutro Abs 3.6  1.4 - 7.7 K/uL   Lymphs Abs 1.4  0.7 - 4.0 K/uL   Monocytes Absolute 0.4  0.1 - 1.0 K/uL   Eosinophils Absolute 0.2  0.0 - 0.7 K/uL   Basophils Absolute 0.0  0.0 - 0.1 K/uL  COMPREHENSIVE METABOLIC PANEL  Result Value Ref Range   Sodium 141  135 - 145 mEq/L   Potassium 4.2  3.5 - 5.1 mEq/L   Chloride 104  96 - 112 mEq/L   CO2 32  19 - 32 mEq/L   Glucose, Bld 88  70 - 99 mg/dL   BUN 18  6 - 23 mg/dL   Creatinine, Ser 0.7  0.4 - 1.2 mg/dL   Total Bilirubin 0.9  0.3 - 1.2 mg/dL   Alkaline Phosphatase 53  39 - 117 U/L   AST 27  0 - 37 U/L   ALT 26  0 - 35 U/L   Total Protein 6.8  6.0 - 8.3 g/dL   Albumin 4.2  3.5 - 5.2 g/dL   Calcium 9.4  8.4 - 10.5 mg/dL   GFR 87.91  >60.00 mL/min  LIPID PANEL      Result Value Ref Range   Cholesterol 178  0 - 200 mg/dL   Triglycerides 67.0  0.0 - 149.0 mg/dL   HDL 73.50  >39.00 mg/dL   VLDL 13.4  0.0 - 40.0 mg/dL   LDL Cholesterol 91  0 - 99 mg/dL   Total CHOL/HDL Ratio 2     great cholesterol   Patient Active Problem List   Diagnosis Date Noted  . Encounter for Medicare annual wellness exam 04/06/2013  . Colon cancer screening 04/06/2013  . GERD 07/07/2010  . VAGINITIS, ATROPHIC 10/15/2009  . OSTEOPENIA 07/19/2007  . HYPERLIPIDEMIA 07/14/2007  . ALLERGIC  RHINITIS 07/14/2007  . ECZEMA 07/14/2007   Past Medical History  Diagnosis Date  . Allergy     allerlgic rhinitis  . Hyperlipidemia   . Osteopenia    Past Surgical History  Procedure Laterality Date  . Abdominal hysterectomy  1997    fibroids   History  Substance Use Topics  . Smoking status: Never Smoker   . Smokeless tobacco: Not on file  . Alcohol Use: No   No family history on file. Allergies  Allergen Reactions  . Alendronate Sodium     REACTION: gerd  . Atorvastatin     REACTION: muscle and joint pain  . Famotidine     REACTION: not effective  . Omeprazole     REACTION: not effective  . Raloxifene     REACTION: hot flashes  . Sulfonamide Derivatives     REACTION: whelps   Current Outpatient Prescriptions on File Prior to Visit  Medication Sig Dispense Refill  . cholecalciferol (VITAMIN D) 1000 UNITS tablet Take 2,000 Units by mouth daily.       Marland Kitchen loratadine-pseudoephedrine (CLARITIN-D 12-HOUR) 5-120 MG per tablet Take 1 tablet by mouth 2 (two) times daily as needed for allergies.      . simvastatin (ZOCOR) 20 MG tablet TAKE 1 TABLET BY MOUTH EVERY DAY  90 tablet  3  . tobramycin-dexamethasone (TOBRADEX) ophthalmic solution Place 1 drop into both eyes 2 (two) times daily as needed.       No current facility-administered medications on file prior to visit.     Review of Systems Review of Systems  Constitutional: Negative for fever, appetite change, fatigue and unexpected weight change.  Eyes: Negative for pain and visual disturbance.  Respiratory: Negative for cough and shortness of breath.   Cardiovascular: Negative for cp or palpitations    Gastrointestinal: Negative for nausea, diarrhea and constipation.  Genitourinary: Negative for urgency and frequency.  Skin: Negative for pallor or rash   Neurological: Negative for weakness, light-headedness, numbness and headaches.  Hematological: Negative for adenopathy. Does not bruise/bleed easily.    Psychiatric/Behavioral: Negative for dysphoric mood. The patient is not nervous/anxious.         Objective:   Physical Exam  Constitutional: She appears well-developed and well-nourished. No distress.  HENT:  Head: Normocephalic and atraumatic.  Right Ear: External ear normal.  Left Ear: External ear normal.  Nose: Nose normal.  Mouth/Throat: Oropharynx is clear and moist.  Eyes: Conjunctivae and EOM are normal. Pupils are equal, round, and reactive to light. Right eye exhibits no discharge. Left eye exhibits no discharge. No scleral icterus.  Neck: Normal range of motion. Neck supple. No JVD present. No thyromegaly present.  Cardiovascular: Normal rate, regular rhythm, normal heart sounds and intact distal pulses.  Exam reveals no gallop.   Pulmonary/Chest: Effort normal and breath sounds normal. No respiratory distress. She has no wheezes. She has no rales.  Abdominal: Soft. Bowel sounds are normal. She exhibits no distension and no mass. There is no tenderness.  Genitourinary: No breast swelling, tenderness, discharge or bleeding.  Breast exam: No mass, nodules, thickening, tenderness, bulging, retraction, inflamation, nipple discharge or skin changes noted.  No axillary or clavicular LA.      Musculoskeletal: She exhibits no edema and no tenderness.  Lymphadenopathy:    She has no cervical adenopathy.  Neurological: She is alert. She has normal reflexes. No cranial nerve deficit. She exhibits normal muscle tone. Coordination normal.  Skin: Skin is warm and dry. No rash noted. No erythema. No pallor.  Psychiatric: She has a normal mood and affect.          Assessment & Plan:

## 2014-05-01 NOTE — Progress Notes (Signed)
Pre visit review using our clinic review tool, if applicable. No additional management support is needed unless otherwise documented below in the visit note. 

## 2014-05-02 NOTE — Assessment & Plan Note (Signed)
D/w patient re:options for colon cancer screening, including IFOB vs. colonoscopy.  Risks and benefits of both were discussed and patient voiced understanding.  Pt elects for:IFOB card  

## 2014-05-02 NOTE — Assessment & Plan Note (Signed)
dexa utd from 2/13 No fx or falls  Intol of fosamax and evista  D level low-disc inc to 2000 iu daily Disc need for calcium/ vitamin D/ wt bearing exercise and bone density test every 2 y to monitor Disc safety/ fracture risk in detail

## 2014-05-02 NOTE — Assessment & Plan Note (Signed)
Disc goals for lipids and reasons to control them Rev labs with pt Rev low sat fat diet in detail Statin and diet  

## 2014-05-02 NOTE — Assessment & Plan Note (Signed)
Reviewed health habits including diet and exercise and skin cancer prevention Reviewed appropriate screening tests for age  Also reviewed health mt list, fam hx and immunization status , as well as social and family history   See HPI Labs reviewed  Enc to work on Insurance underwriter

## 2014-05-03 ENCOUNTER — Other Ambulatory Visit (INDEPENDENT_AMBULATORY_CARE_PROVIDER_SITE_OTHER): Payer: Medicare Other

## 2014-05-03 DIAGNOSIS — Z1211 Encounter for screening for malignant neoplasm of colon: Secondary | ICD-10-CM

## 2014-05-03 LAB — FECAL OCCULT BLOOD, IMMUNOCHEMICAL: FECAL OCCULT BLD: NEGATIVE

## 2014-05-07 ENCOUNTER — Encounter: Payer: Self-pay | Admitting: *Deleted

## 2014-05-14 ENCOUNTER — Encounter: Payer: Self-pay | Admitting: Family Medicine

## 2014-05-24 ENCOUNTER — Other Ambulatory Visit: Payer: Self-pay | Admitting: Family Medicine

## 2014-05-24 NOTE — Telephone Encounter (Signed)
Pt wants to ck status of simvastatin refill; advised pt spoke with Baxter Flattery at [facility] and refilled was received and pt to ck with pharmacy later today to pick up rx.

## 2014-07-01 ENCOUNTER — Ambulatory Visit (INDEPENDENT_AMBULATORY_CARE_PROVIDER_SITE_OTHER): Payer: Medicare Other | Admitting: Family Medicine

## 2014-07-01 ENCOUNTER — Encounter: Payer: Self-pay | Admitting: Family Medicine

## 2014-07-01 VITALS — BP 124/78 | HR 74 | Temp 98.0°F | Ht 63.75 in | Wt 137.2 lb

## 2014-07-01 DIAGNOSIS — J069 Acute upper respiratory infection, unspecified: Secondary | ICD-10-CM

## 2014-07-01 DIAGNOSIS — B9789 Other viral agents as the cause of diseases classified elsewhere: Principal | ICD-10-CM

## 2014-07-01 NOTE — Patient Instructions (Signed)
I think you have had a viral upper respiratory infection and the diarrhea was more likely from fruit Drink fluids and rest  Keep track of temperature - I suspect you have had a fever Tylenol for aches  No more immodium Update if not starting to improve in a week or if worsening

## 2014-07-01 NOTE — Assessment & Plan Note (Signed)
Sounds much improved now  Enc to check her temp if she feels achey- does sound like she has had fever Disc symptomatic care - see instructions on AVS  Update if not starting to improve in a week or if worsening

## 2014-07-01 NOTE — Progress Notes (Signed)
Subjective:    Patient ID: Kylie Acosta, female    DOB: 1946-04-03, 68 y.o.   MRN: [id number]  HPI Here for multiple symptoms   She recently had her grandkids for a mo  (they were not sick but worse her out)  Developed a cough several weeks into that - then exhausted and aching (after they left) Last Thursday- lost her appetite and stayed in bed - just felt like eating fruit - so she only ate fruit and that upset her stomach-diarrhea  Took immodium otc  She also took some tylenol fri-sun- stayed in bed  Today - improved except for sore stomach - today feels a bit upset -no longer having diarrhea  No blood in stool  She never took her temp - did have some chills and sweats  Cough was never productive   Patient Active Problem List   Diagnosis Date Noted  . Encounter for Medicare annual wellness exam 04/06/2013  . Colon cancer screening 04/06/2013  . GERD 07/07/2010  . VAGINITIS, ATROPHIC 10/15/2009  . OSTEOPENIA 07/19/2007  . HYPERLIPIDEMIA 07/14/2007  . ALLERGIC RHINITIS 07/14/2007  . ECZEMA 07/14/2007   Past Medical History  Diagnosis Date  . Allergy     allerlgic rhinitis  . Hyperlipidemia   . Osteopenia    Past Surgical History  Procedure Laterality Date  . Abdominal hysterectomy  1997    fibroids   History  Substance Use Topics  . Smoking status: Never Smoker   . Smokeless tobacco: Not on file  . Alcohol Use: No   No family history on file. Allergies  Allergen Reactions  . Alendronate Sodium     REACTION: gerd  . Atorvastatin     REACTION: muscle and joint pain  . Famotidine     REACTION: not effective  . Omeprazole     REACTION: not effective  . Raloxifene     REACTION: hot flashes  . Sulfonamide Derivatives     REACTION: whelps   Current Outpatient Prescriptions on File Prior to Visit  Medication Sig Dispense Refill  . cholecalciferol (VITAMIN D) 1000 UNITS tablet Take 2,000 Units by mouth daily.       Marland Kitchen loratadine-pseudoephedrine  (CLARITIN-D 12-HOUR) 5-120 MG per tablet Take 1 tablet by mouth 2 (two) times daily as needed for allergies.      . Omega-3 Fatty Acids (OMEGA 3 PO) Take 1 capsule by mouth daily.      . simvastatin (ZOCOR) 20 MG tablet TAKE 1 TABLET BY MOUTH EVERY DAY  90 tablet  1   No current facility-administered medications on file prior to visit.     Review of Systems Review of Systems  Constitutional: pos for loss of appetite and malaise -now improved   Eyes: Negative for pain and visual disturbance.  ENT pos for post nasal drip/ neg for sinus pain  Respiratory: Negative for wheeze  and shortness of breath.   Cardiovascular: Negative for cp or palpitations    Gastrointestinal: Negative for nausea, and constipation. neg for abd pain or blood in stool  Genitourinary: Negative for urgency and frequency.  Skin: Negative for pallor or rash   Neurological: Negative for weakness, light-headedness, numbness and headaches.  Hematological: Negative for adenopathy. Does not bruise/bleed easily.  Psychiatric/Behavioral: Negative for dysphoric mood. The patient is not nervous/anxious.         Objective:   Physical Exam  Constitutional: She appears well-developed and well-nourished. No distress.  HENT:  Head: Normocephalic and atraumatic.  Right Ear: External  ear normal.  Left Ear: External ear normal.  Mouth/Throat: Oropharynx is clear and moist. No oropharyngeal exudate.  Nares are boggy Clear post nasal drip   Eyes: Conjunctivae and EOM are normal. Pupils are equal, round, and reactive to light. Right eye exhibits no discharge. Left eye exhibits no discharge.  Neck: Normal range of motion. Neck supple. No thyromegaly present.  Cardiovascular: Normal rate and regular rhythm.  Exam reveals no gallop.   Pulmonary/Chest: Effort normal and breath sounds normal. No respiratory distress. She has no wheezes. She has no rales.  Abdominal: Soft. Bowel sounds are normal. She exhibits no distension and no mass.  There is no tenderness. There is no rebound.  Lymphadenopathy:    She has no cervical adenopathy.  Neurological: She is alert.  Skin: Skin is warm and dry. No rash noted. No erythema. No pallor.  Psychiatric: She has a normal mood and affect.          Assessment & Plan:   Problem List Items Addressed This Visit     Respiratory   Viral URI with cough - Primary     Sounds much improved now  Enc to check her temp if she feels achey- does sound like she has had fever Disc symptomatic care - see instructions on AVS  Update if not starting to improve in a week or if worsening

## 2014-07-01 NOTE — Progress Notes (Signed)
Pre visit review using our clinic review tool, if applicable. No additional management support is needed unless otherwise documented below in the visit note. 

## 2014-07-29 ENCOUNTER — Encounter: Payer: Medicare Other | Admitting: Family Medicine

## 2014-11-22 ENCOUNTER — Ambulatory Visit: Payer: Medicare Other

## 2014-11-27 ENCOUNTER — Other Ambulatory Visit: Payer: Self-pay | Admitting: Family Medicine

## 2015-06-02 ENCOUNTER — Encounter: Payer: Self-pay | Admitting: Family Medicine

## 2015-06-05 ENCOUNTER — Telehealth: Payer: Self-pay | Admitting: Family Medicine

## 2015-06-05 NOTE — Telephone Encounter (Signed)
Pt is wanting to know if you can work her into a cpe in a different slot like you were able to last year.  cb number is [phone], thanks.

## 2015-06-05 NOTE — Telephone Encounter (Signed)
Please work her in where able

## 2015-06-06 NOTE — Telephone Encounter (Signed)
Scheduled 8/22 pt aware

## 2015-06-17 ENCOUNTER — Other Ambulatory Visit: Payer: Self-pay | Admitting: Family Medicine

## 2015-07-30 ENCOUNTER — Telehealth: Payer: Self-pay | Admitting: Family Medicine

## 2015-07-30 DIAGNOSIS — E559 Vitamin D deficiency, unspecified: Secondary | ICD-10-CM

## 2015-07-30 DIAGNOSIS — E785 Hyperlipidemia, unspecified: Secondary | ICD-10-CM

## 2015-07-30 DIAGNOSIS — Z0000 Encounter for general adult medical examination without abnormal findings: Secondary | ICD-10-CM

## 2015-07-30 NOTE — Telephone Encounter (Signed)
-----   Message from Ellamae Sia sent at 07/24/2015  2:50 PM EDT ----- Regarding: Lab orders for Thursday, 8.18.16 Patient is scheduled for CPX labs, please order future labs, Thanks , Karna Christmas

## 2015-07-31 ENCOUNTER — Encounter (INDEPENDENT_AMBULATORY_CARE_PROVIDER_SITE_OTHER): Payer: Self-pay

## 2015-07-31 ENCOUNTER — Other Ambulatory Visit (INDEPENDENT_AMBULATORY_CARE_PROVIDER_SITE_OTHER): Payer: Medicare Other

## 2015-07-31 DIAGNOSIS — Z0000 Encounter for general adult medical examination without abnormal findings: Secondary | ICD-10-CM

## 2015-07-31 DIAGNOSIS — E785 Hyperlipidemia, unspecified: Secondary | ICD-10-CM

## 2015-07-31 DIAGNOSIS — E559 Vitamin D deficiency, unspecified: Secondary | ICD-10-CM | POA: Diagnosis not present

## 2015-07-31 LAB — COMPREHENSIVE METABOLIC PANEL
ALBUMIN: 4.2 g/dL (ref 3.5–5.2)
ALK PHOS: 50 U/L (ref 39–117)
ALT: 17 U/L (ref 0–35)
AST: 22 U/L (ref 0–37)
BUN: 15 mg/dL (ref 6–23)
CALCIUM: 9.5 mg/dL (ref 8.4–10.5)
CHLORIDE: 103 meq/L (ref 96–112)
CO2: 29 mEq/L (ref 19–32)
CREATININE: 0.81 mg/dL (ref 0.40–1.20)
GFR: 74 mL/min (ref >60.00)
Glucose, Bld: 92 mg/dL (ref 70–99)
Potassium: 4.6 mEq/L (ref 3.5–5.1)
SODIUM: 141 meq/L (ref 135–145)
TOTAL PROTEIN: 6.8 g/dL (ref 6.0–8.3)
Total Bilirubin: 0.8 mg/dL (ref 0.2–1.2)

## 2015-07-31 LAB — CBC WITH DIFFERENTIAL/PLATELET
BASOS PCT: 0.5 % (ref 0.0–3.0)
Basophils Absolute: 0 10*3/uL (ref 0.0–0.1)
EOS ABS: 0.2 10*3/uL (ref 0.0–0.7)
EOS PCT: 3.5 % (ref 0.0–5.0)
HEMATOCRIT: 39.3 % (ref 36.0–46.0)
HEMOGLOBIN: 13.2 g/dL (ref 12.0–15.0)
LYMPHS PCT: 22.5 % (ref 12.0–46.0)
Lymphs Abs: 1.2 10*3/uL (ref 0.7–4.0)
MCHC: 33.7 g/dL (ref 30.0–36.0)
MCV: 95 fl (ref 78.0–100.0)
Monocytes Absolute: 0.4 10*3/uL (ref 0.1–1.0)
Monocytes Relative: 7.7 % (ref 3.0–12.0)
Neutro Abs: 3.5 10*3/uL (ref 1.4–7.7)
Neutrophils Relative %: 65.8 % (ref 43.0–77.0)
Platelets: 265 10*3/uL (ref 150.0–400.0)
RBC: 4.14 Mil/uL (ref 3.87–5.11)
RDW: 12.8 % (ref 11.5–15.5)
WBC: 5.3 10*3/uL (ref 4.0–10.5)

## 2015-07-31 LAB — LIPID PANEL
CHOLESTEROL: 166 mg/dL (ref 0–200)
HDL: 61.3 mg/dL (ref >39.00)
LDL CALC: 90 mg/dL (ref 0–99)
NonHDL: 104.78
TRIGLYCERIDES: 75 mg/dL (ref 0.0–149.0)
Total CHOL/HDL Ratio: 3
VLDL: 15 mg/dL (ref 0.0–40.0)

## 2015-07-31 LAB — TSH: TSH: 3.48 u[IU]/mL (ref 0.35–4.50)

## 2015-07-31 LAB — VITAMIN D 25 HYDROXY (VIT D DEFICIENCY, FRACTURES): VITD: 44.94 ng/mL (ref 30.00–100.00)

## 2015-08-04 ENCOUNTER — Ambulatory Visit (INDEPENDENT_AMBULATORY_CARE_PROVIDER_SITE_OTHER): Payer: Medicare Other | Admitting: Family Medicine

## 2015-08-04 ENCOUNTER — Encounter: Payer: Self-pay | Admitting: Family Medicine

## 2015-08-04 VITALS — BP 110/66 | HR 72 | Temp 98.0°F | Ht 64.0 in | Wt 138.8 lb

## 2015-08-04 DIAGNOSIS — M8580 Other specified disorders of bone density and structure, unspecified site: Secondary | ICD-10-CM | POA: Diagnosis not present

## 2015-08-04 DIAGNOSIS — E559 Vitamin D deficiency, unspecified: Secondary | ICD-10-CM

## 2015-08-04 DIAGNOSIS — Z0000 Encounter for general adult medical examination without abnormal findings: Secondary | ICD-10-CM | POA: Diagnosis not present

## 2015-08-04 DIAGNOSIS — Z1211 Encounter for screening for malignant neoplasm of colon: Secondary | ICD-10-CM

## 2015-08-04 DIAGNOSIS — E785 Hyperlipidemia, unspecified: Secondary | ICD-10-CM

## 2015-08-04 MED ORDER — MOMETASONE FUROATE 50 MCG/ACT NA SUSP: 2.0000 | Freq: Every day | NASAL | Status: DC

## 2015-08-04 MED ORDER — SIMVASTATIN 20 MG PO TABS: 20.0000 mg | ORAL_TABLET | Freq: Every day | ORAL | Status: DC

## 2015-08-04 NOTE — Assessment & Plan Note (Signed)
Reviewed health habits including diet and exercise and skin cancer prevention Reviewed appropriate screening tests for age  Also reviewed health mt list, fam hx and immunization status , as well as social and family history   See HPI Labs reviewed  Declines hep C test due to low risk status  IFOB kit for colon screen She wants to get dexa in the spring  Overall healthy

## 2015-08-04 NOTE — Assessment & Plan Note (Signed)
Vitamin D level is therapeutic with current supplementation Disc importance of this to bone and overall health  

## 2015-08-04 NOTE — Patient Instructions (Signed)
Please do your stool card kit for colon cancer screening  Get your flu shot in the fall  Call us a month before you want to get you bone density test so we can make a referral

## 2015-08-04 NOTE — Assessment & Plan Note (Signed)
Pt declines colonoscopy  IFOB kit given

## 2015-08-04 NOTE — Assessment & Plan Note (Signed)
Good control with simvastatin and diet  Disc goals for lipids and reasons to control them Rev labs with pt Rev low sat fat diet in detail

## 2015-08-04 NOTE — Progress Notes (Signed)
Subjective:    Patient ID: Kylie Acosta, female    DOB: 1946-01-06, 69 y.o.   MRN: [id number]  HPI Here for annual medicare wellness visit as well as chronic/acute medical problems as well as annual preventative examination  I have personally reviewed the Medicare Annual Wellness questionnaire and have noted 1. The patient's medical and social history 2. Their use of alcohol, tobacco or illicit drugs 3. Their current medications and supplements 4. The patient's functional ability including ADL's, fall risks, home safety risks and hearing or visual             impairment. 5. Diet and physical activities 6. Evidence for depression or mood disorders  The patients weight, height, BMI have been recorded in the chart and visual acuity is per eye clinic.  I have made referrals, counseling and provided education to the patient based review of the above and I have provided the pt with a written personalized care plan for preventive services. Reviewed and updated provider list, see scanned forms.  Doing well overall   See scanned forms.  Routine anticipatory guidance given to patient.  See health maintenance. Colon cancer screening 7/03 and then 5/15 nl ifob - would rather do stool card than colonoscopy  Breast cancer screening 6/16 nl  Self breast exam-no lumps or changes  Flu vaccine had it last season , plans to get one in the fall  Tetanus vaccine 4/14 Pneumovax 5/15 - up to date on both  Zoster vaccine 8/08  Hep C screening - not high risk / declines  dexa 2/13 stable osteopenia , D level 44 , no falls or fractures , she takes her D (does not tolerate extra calcium - she will consider it in the spring (will call to schedule) Advance directive has a living will and power of attorney  Cognitive function addressed- see scanned forms- and if abnormal then additional documentation follows. - overall doing well    PMH and SH reviewed  Meds, vitals, and allergies reviewed.   ROS: See  HPI.  Otherwise negative.    Wt is up 1 lb with bmi of 23  Hyperlipidemia Lab Results  Component Value Date   CHOL 166 07/31/2015   CHOL 178 04/10/2014   CHOL 148 04/11/2013   Lab Results  Component Value Date   HDL 61.30 07/31/2015   HDL 73.50 04/10/2014   HDL 64.40 04/11/2013   Lab Results  Component Value Date   LDLCALC 90 07/31/2015   LDLCALC 91 04/10/2014   LDLCALC 73 04/11/2013   Lab Results  Component Value Date   TRIG 75.0 07/31/2015   TRIG 67.0 04/10/2014   TRIG 53.0 04/11/2013   Lab Results  Component Value Date   CHOLHDL 3 07/31/2015   CHOLHDL 2 04/10/2014   CHOLHDL 2 04/11/2013   Lab Results  Component Value Date   LDLDIRECT 151.5 09/01/2010   on zocor and diet  Very good profile - takes her med and watches diet (some of the time)     Chemistry      Component Value Date/Time   NA 141 07/31/2015 1001   K 4.6 07/31/2015 1001   CL 103 07/31/2015 1001   CO2 29 07/31/2015 1001   BUN 15 07/31/2015 1001   CREATININE 0.81 07/31/2015 1001      Component Value Date/Time   CALCIUM 9.5 07/31/2015 1001   ALKPHOS 50 07/31/2015 1001   AST 22 07/31/2015 1001   ALT 17 07/31/2015 1001   BILITOT 0.8 07/31/2015  1001     Lab Results  Component Value Date   WBC 5.3 07/31/2015   HGB 13.2 07/31/2015   HCT 39.3 07/31/2015   MCV 95.0 07/31/2015   PLT 265.0 07/31/2015    Lab Results  Component Value Date   TSH 3.48 07/31/2015    Patient Active Problem List   Diagnosis Date Noted  . Routine general medical examination at a health care facility 07/30/2015  . Vitamin D deficiency 07/30/2015  . Encounter for Medicare annual wellness exam 04/06/2013  . Colon cancer screening 04/06/2013  . GERD 07/07/2010  . VAGINITIS, ATROPHIC 10/15/2009  . Osteopenia 07/19/2007  . Hyperlipidemia 07/14/2007  . ALLERGIC RHINITIS 07/14/2007  . ECZEMA 07/14/2007   Past Medical History  Diagnosis Date  . Allergy     allerlgic rhinitis  . Hyperlipidemia   . Osteopenia      Past Surgical History  Procedure Laterality Date  . Abdominal hysterectomy  1997    fibroids   Social History  Substance Use Topics  . Smoking status: Never Smoker   . Smokeless tobacco: None  . Alcohol Use: No   No family history on file. Allergies  Allergen Reactions  . Alendronate Sodium     REACTION: gerd  . Atorvastatin     REACTION: muscle and joint pain  . Famotidine     REACTION: not effective  . Omeprazole     REACTION: not effective  . Raloxifene     REACTION: hot flashes  . Sulfonamide Derivatives     REACTION: whelps   Current Outpatient Prescriptions on File Prior to Visit  Medication Sig Dispense Refill  . cholecalciferol (VITAMIN D) 1000 UNITS tablet Take 2,000 Units by mouth daily.     Marland Kitchen loratadine-pseudoephedrine (CLARITIN-D 12-HOUR) 5-120 MG per tablet Take 1 tablet by mouth 2 (two) times daily as needed for allergies.     No current facility-administered medications on file prior to visit.    Review of Systems Review of Systems  Constitutional: Negative for fever, appetite change, fatigue and unexpected weight change.  Eyes: Negative for pain and visual disturbance.  Respiratory: Negative for cough and shortness of breath.   Cardiovascular: Negative for cp or palpitations    Gastrointestinal: Negative for nausea, diarrhea and constipation.  Genitourinary: Negative for urgency and frequency.  Skin: Negative for pallor or rash   Neurological: Negative for weakness, light-headedness, numbness and headaches.  Hematological: Negative for adenopathy. Does not bruise/bleed easily.  Psychiatric/Behavioral: Negative for dysphoric mood. The patient is not nervous/anxious.         Objective:   Physical Exam  Constitutional: She appears well-developed and well-nourished. No distress.  Well appearing   HENT:  Head: Normocephalic and atraumatic.  Right Ear: External ear normal.  Left Ear: External ear normal.  Mouth/Throat: Oropharynx is clear and  moist.  Eyes: Conjunctivae and EOM are normal. Pupils are equal, round, and reactive to light. No scleral icterus.  Neck: Normal range of motion. Neck supple. No JVD present. Carotid bruit is not present. No thyromegaly present.  Cardiovascular: Normal rate, regular rhythm, normal heart sounds and intact distal pulses.  Exam reveals no gallop.   Pulmonary/Chest: Effort normal and breath sounds normal. No respiratory distress. She has no wheezes. She exhibits no tenderness.  Abdominal: Soft. Bowel sounds are normal. She exhibits no distension, no abdominal bruit and no mass. There is no tenderness.  Genitourinary: No breast swelling, tenderness, discharge or bleeding.  Breast exam: No mass, nodules, thickening, tenderness, bulging, retraction,  inflamation, nipple discharge or skin changes noted.  No axillary or clavicular LA.      Musculoskeletal: Normal range of motion. She exhibits no edema or tenderness.  No kyphosis   Lymphadenopathy:    She has no cervical adenopathy.  Neurological: She is alert. She has normal reflexes. No cranial nerve deficit. She exhibits normal muscle tone. Coordination normal.  Skin: Skin is warm and dry. No rash noted. No erythema. No pallor.  Some lentigo on trunk and back   Psychiatric: She has a normal mood and affect.          Assessment & Plan:   Problem List Items Addressed This Visit    Colon cancer screening    Pt declines colonoscopy  IFOB kit given       Relevant Orders   Fecal occult blood, imunochemical   Encounter for Medicare annual wellness exam - Primary    Reviewed health habits including diet and exercise and skin cancer prevention Reviewed appropriate screening tests for age  Also reviewed health mt list, fam hx and immunization status , as well as social and family history   See HPI Labs reviewed  Declines hep C test due to low risk status  IFOB kit for colon screen She wants to get dexa in the spring  Overall healthy       Hyperlipidemia    Good control with simvastatin and diet  Disc goals for lipids and reasons to control them Rev labs with pt Rev low sat fat diet in detail       Relevant Medications   simvastatin (ZOCOR) 20 MG tablet   Osteopenia    dexa 2013 with stable osteopenia No falls or fractures  Disc need for calcium/ vitamin D/ wt bearing exercise and bone density test every 2 y to monitor Disc safety/ fracture risk in detail    D level in 30s   Will call us in the spring for dexa referral       Routine general medical examination at a health care facility    Reviewed health habits including diet and exercise and skin cancer prevention Reviewed appropriate screening tests for age  Also reviewed health mt list, fam hx and immunization status , as well as social and family history   See HPI Labs reviewed  Declines hep C test due to low risk status  IFOB kit for colon screen She wants to get dexa in the spring  Overall healthy      Vitamin D deficiency    Vitamin D level is therapeutic with current supplementation Disc importance of this to bone and overall health

## 2015-08-04 NOTE — Progress Notes (Signed)
Pre visit review using our clinic review tool, if applicable. No additional management support is needed unless otherwise documented below in the visit note. 

## 2015-08-04 NOTE — Assessment & Plan Note (Signed)
dexa 2013 with stable osteopenia No falls or fractures  Disc need for calcium/ vitamin D/ wt bearing exercise and bone density test every 2 y to monitor Disc safety/ fracture risk in detail    D level in 40s   Will call us in the spring for dexa referral

## 2015-08-25 ENCOUNTER — Other Ambulatory Visit (INDEPENDENT_AMBULATORY_CARE_PROVIDER_SITE_OTHER): Payer: Medicare Other

## 2015-08-25 DIAGNOSIS — Z1211 Encounter for screening for malignant neoplasm of colon: Secondary | ICD-10-CM

## 2015-08-25 LAB — FECAL OCCULT BLOOD, GUAIAC: Fecal Occult Blood: NEGATIVE

## 2015-08-25 LAB — FECAL OCCULT BLOOD, IMMUNOCHEMICAL: Fecal Occult Bld: NEGATIVE

## 2015-08-26 ENCOUNTER — Encounter: Payer: Self-pay | Admitting: *Deleted

## 2015-08-26 ENCOUNTER — Encounter: Payer: Self-pay | Admitting: Family Medicine

## 2015-09-18 ENCOUNTER — Other Ambulatory Visit: Payer: Self-pay | Admitting: Family Medicine

## 2015-10-22 ENCOUNTER — Ambulatory Visit (INDEPENDENT_AMBULATORY_CARE_PROVIDER_SITE_OTHER): Payer: Medicare Other

## 2015-10-22 DIAGNOSIS — Z23 Encounter for immunization: Secondary | ICD-10-CM

## 2016-06-29 LAB — HM DEXA SCAN

## 2016-07-02 ENCOUNTER — Telehealth: Payer: Self-pay | Admitting: Family Medicine

## 2016-07-02 DIAGNOSIS — Z0000 Encounter for general adult medical examination without abnormal findings: Secondary | ICD-10-CM

## 2016-07-02 DIAGNOSIS — E785 Hyperlipidemia, unspecified: Secondary | ICD-10-CM

## 2016-07-02 DIAGNOSIS — E559 Vitamin D deficiency, unspecified: Secondary | ICD-10-CM

## 2016-07-02 NOTE — Telephone Encounter (Signed)
-----   Message from Ellamae Sia sent at 06/30/2016  3:12 PM EDT ----- Regarding: Lab orders for Thursday, 7.27.17  AWV lab orders, please.

## 2016-07-06 ENCOUNTER — Encounter: Payer: Self-pay | Admitting: Family Medicine

## 2016-07-08 ENCOUNTER — Telehealth: Payer: Self-pay

## 2016-07-08 ENCOUNTER — Ambulatory Visit (INDEPENDENT_AMBULATORY_CARE_PROVIDER_SITE_OTHER): Payer: Medicare Other

## 2016-07-08 ENCOUNTER — Other Ambulatory Visit (INDEPENDENT_AMBULATORY_CARE_PROVIDER_SITE_OTHER): Payer: Medicare Other

## 2016-07-08 VITALS — BP 118/80 | HR 68 | Temp 97.8°F | Ht 64.0 in | Wt 134.5 lb

## 2016-07-08 DIAGNOSIS — E559 Vitamin D deficiency, unspecified: Secondary | ICD-10-CM

## 2016-07-08 DIAGNOSIS — Z0000 Encounter for general adult medical examination without abnormal findings: Secondary | ICD-10-CM | POA: Diagnosis not present

## 2016-07-08 LAB — CBC WITH DIFFERENTIAL/PLATELET
BASOS ABS: 0 10*3/uL (ref 0.0–0.1)
Basophils Relative: 0.5 % (ref 0.0–3.0)
Eosinophils Absolute: 0.1 10*3/uL (ref 0.0–0.7)
Eosinophils Relative: 2.7 % (ref 0.0–5.0)
HEMATOCRIT: 38.2 % (ref 36.0–46.0)
HEMOGLOBIN: 12.9 g/dL (ref 12.0–15.0)
LYMPHS PCT: 24.7 % (ref 12.0–46.0)
Lymphs Abs: 1.2 10*3/uL (ref 0.7–4.0)
MCHC: 33.8 g/dL (ref 30.0–36.0)
MCV: 93.7 fl (ref 78.0–100.0)
MONOS PCT: 6.9 % (ref 3.0–12.0)
Monocytes Absolute: 0.3 10*3/uL (ref 0.1–1.0)
Neutro Abs: 3.2 10*3/uL (ref 1.4–7.7)
Neutrophils Relative %: 65.2 % (ref 43.0–77.0)
Platelets: 314 10*3/uL (ref 150.0–400.0)
RBC: 4.08 Mil/uL (ref 3.87–5.11)
RDW: 12.1 % (ref 11.5–15.5)
WBC: 4.9 10*3/uL (ref 4.0–10.5)

## 2016-07-08 LAB — COMPREHENSIVE METABOLIC PANEL
ALK PHOS: 51 U/L (ref 39–117)
ALT: 16 U/L (ref 0–35)
AST: 21 U/L (ref 0–37)
Albumin: 4.2 g/dL (ref 3.5–5.2)
BILIRUBIN TOTAL: 0.9 mg/dL (ref 0.2–1.2)
BUN: 13 mg/dL (ref 6–23)
CALCIUM: 9.3 mg/dL (ref 8.4–10.5)
CO2: 31 mEq/L (ref 19–32)
CREATININE: 0.73 mg/dL (ref 0.40–1.20)
Chloride: 102 mEq/L (ref 96–112)
GFR: 83.22 mL/min (ref >60.00)
Glucose, Bld: 93 mg/dL (ref 70–99)
Potassium: 3.9 mEq/L (ref 3.5–5.1)
Sodium: 138 mEq/L (ref 135–145)
TOTAL PROTEIN: 6.8 g/dL (ref 6.0–8.3)

## 2016-07-08 LAB — LIPID PANEL
Cholesterol: 151 mg/dL (ref 0–200)
HDL: 60.9 mg/dL (ref >39.00)
LDL Cholesterol: 78 mg/dL (ref 0–99)
NONHDL: 90.01
TRIGLYCERIDES: 59 mg/dL (ref 0.0–149.0)
Total CHOL/HDL Ratio: 2
VLDL: 11.8 mg/dL (ref 0.0–40.0)

## 2016-07-08 LAB — TSH: TSH: 2.73 u[IU]/mL (ref 0.35–4.50)

## 2016-07-08 LAB — VITAMIN D 25 HYDROXY (VIT D DEFICIENCY, FRACTURES): VITD: 49.53 ng/mL (ref 30.00–100.00)

## 2016-07-08 NOTE — Patient Instructions (Addendum)
Kylie Acosta , Thank you for taking time to come for your Medicare Wellness Visit. I appreciate your ongoing commitment to your health goals. Please review the following plan we discussed and let me know if I can assist you in the future.   These are the goals we discussed: Goals    . Increase physical activity          Starting 07/08/2016, I will continue to walk at least 30 min daily.        This is a list of the screening recommended for you and due dates:  Health Maintenance  Topic Date Due  .  Hepatitis C: One time screening is recommended by Center for Disease Control  (CDC) for  adults born from [birth year] through 1967.   01/14/2021*  . Colon Cancer Screening  07/08/2024*  . Flu Shot  07/13/2016  . Stool Blood Test  08/24/2016  . Mammogram  06/29/2018  . Tetanus Vaccine  04/07/2023  . DEXA scan (bone density measurement)  Completed  . Shingles Vaccine  Completed  . Pneumonia vaccines  Completed  *Topic was postponed. The date shown is not the original due date.   Preventive Care for Adults  A healthy lifestyle and preventive care can promote health and wellness. Preventive health guidelines for adults include the following key practices.  . A routine yearly physical is a good way to check with your health care provider about your health and preventive screening. It is a chance to share any concerns and updates on your health and to receive a thorough exam.  . Visit your dentist for a routine exam and preventive care every 6 months. Brush your teeth twice a day and floss once a day. Good oral hygiene prevents tooth decay and gum disease.  . The frequency of eye exams is based on your age, health, family medical history, use  of contact lenses, and other factors. Follow your health care provider's ecommendations for frequency of eye exams.  . Eat a healthy diet. Foods like vegetables, fruits, whole grains, low-fat dairy products, and lean protein foods contain the nutrients you need  without too many calories. Decrease your intake of foods high in solid fats, added sugars, and salt. Eat the right amount of calories for you. Get information about a proper diet from your health care provider, if necessary.  . Regular physical exercise is one of the most important things you can do for your health. Most adults should get at least 150 minutes of moderate-intensity exercise (any activity that increases your heart rate and causes you to sweat) each week. In addition, most adults need muscle-strengthening exercises on 2 or more days a week.  Silver Sneakers may be a benefit available to you. To determine eligibility, you may visit the website: www.silversneakers.com or contact program at 8180434556 Mon-Fri between 8AM-8PM.   . Maintain a healthy weight. The body mass index (BMI) is a screening tool to identify possible weight problems. It provides an estimate of body fat based on height and weight. Your health care provider can find your BMI and can help you achieve or maintain a healthy weight.   For adults 20 years and older: ? A BMI below 18.5 is considered underweight. ? A BMI of 18.5 to 24.9 is normal. ? A BMI of 25 to 29.9 is considered overweight. ? A BMI of 30 and above is considered obese.   . Maintain normal blood lipids and cholesterol levels by exercising and minimizing your intake of  saturated fat. Eat a balanced diet with plenty of fruit and vegetables. Blood tests for lipids and cholesterol should begin at age 26 and be repeated every 5 years. If your lipid or cholesterol levels are high, you are over 50, or you are at high risk for heart disease, you [place] need your cholesterol levels checked more frequently. Ongoing high lipid and cholesterol levels should be treated with medicines if diet and exercise are not working.  . If you smoke, find out from your health care provider how to quit. If you do not use tobacco, please do not start.  . If you choose to drink  alcohol, please do not consume more than 2 drinks per day. One drink is considered to be 12 ounces (355 mL) of beer, 5 ounces (148 mL) of wine, or 1.5 ounces (44 mL) of liquor.  . If you are 78-88 years old, ask your health care provider if you should take aspirin to prevent strokes.  . Use sunscreen. Apply sunscreen liberally and repeatedly throughout the day. You should seek shade when your shadow is shorter than you. Protect yourself by wearing long sleeves, pants, a wide-brimmed hat, and sunglasses year round, whenever you are outdoors.  . Once a month, do a whole body skin exam, using a mirror to look at the skin on your back. Tell your health care provider of new moles, moles that have irregular borders, moles that are larger than a pencil eraser, or moles that have changed in shape or color.

## 2016-07-08 NOTE — Progress Notes (Signed)
PCP notes:   Health maintenance:  No gaps identified or addressed   Abnormal screenings:   Fall risk - hx of fall without injury  Patient concerns:   None  Nurse concerns:  Pt was provided FOBT kit today and asked to return after 08/24/2016.   Next PCP appt:   Will be scheduled

## 2016-07-08 NOTE — Telephone Encounter (Signed)
Left VM requesting pt to call office about change to CPE date.

## 2016-07-08 NOTE — Progress Notes (Signed)
Subjective:   Kylie Acosta is a 70 y.o. female who presents for Medicare Annual (Subsequent) preventive examination.  Review of Systems:  N/A Cardiac Risk Factors include: advanced age (>73men, >21 women);dyslipidemia     Objective:     Vitals: BP 118/80 (BP Location: Left Arm, Patient Position: Sitting, Cuff Size: Normal)   Pulse 68   Temp 97.8 F (36.6 C) (Oral)   Ht 5\' 4"  (1.626 m) Comment: no shoes  Wt 134 lb 8 oz (61 kg)   SpO2 97%   BMI 23.09 kg/m   Body mass index is 23.09 kg/m.   Tobacco History  Smoking Status  . Never Smoker  Smokeless Tobacco  . Never Used     Counseling given: No   Past Medical History:  Diagnosis Date  . Allergy    allerlgic rhinitis  . Hyperlipidemia   . Osteopenia    Past Surgical History:  Procedure Laterality Date  . ABDOMINAL HYSTERECTOMY  1997   fibroids   History reviewed. No pertinent family history. History  Sexual Activity  . Sexual activity: No    Outpatient Encounter Prescriptions as of 07/08/2016  Medication Sig  . cholecalciferol (VITAMIN D) 1000 UNITS tablet Take 2,000 Units by mouth daily.   Marland Kitchen loratadine-pseudoephedrine (CLARITIN-D 12-HOUR) 5-120 MG per tablet Take 1 tablet by mouth 2 (two) times daily as needed for allergies.  . mometasone (NASONEX) 50 MCG/ACT nasal spray Place 2 sprays into the nose daily.  . simvastatin (ZOCOR) 20 MG tablet Take 1 tablet (20 mg total) by mouth daily.   No facility-administered encounter medications on file as of 07/08/2016.     Activities of Daily Living In your present state of health, do you have any difficulty performing the following activities: 07/08/2016  Hearing? N  Vision? N  Difficulty concentrating or making decisions? N  Walking or climbing stairs? N  Dressing or bathing? N  Doing errands, shopping? N  Preparing Food and eating ? N  Using the Toilet? N  In the past six months, have you accidently leaked urine? N  Do you have problems with loss of  bowel control? N  Managing your Medications? N  Managing your Finances? N  Housekeeping or managing your Housekeeping? N  Some recent data might be hidden    Patient Care Team: Abner Greenspan, MD as PCP - General Ralene Bathe, MD as Referring Physician (Dermatology) Garfield Cornea, OD as Referring Physician (Optometry)    Assessment:     Hearing Screening   125Hz  250Hz  500Hz  1000Hz  2000Hz  3000Hz  4000Hz  6000Hz  8000Hz   Right ear:   40 40 40  40    Left ear:   40 40 40  40    Vision Screening Comments: Last vision exam in Fall 2016 with Dr. Steffanie Rainwater   Exercise Activities and Dietary recommendations Current Exercise Habits: Home exercise routine, Type of exercise: walking, Time (Minutes): 30, Frequency (Times/Week): 7, Weekly Exercise (Minutes/Week): 210, Intensity: Mild, Exercise limited by: None identified  Goals    . Increase physical activity          Starting 07/08/2016, I will continue to walk at least 30 min daily.       Fall Risk Fall Risk  07/08/2016 08/04/2015 05/01/2014  Falls in the past year? Yes No No  Number falls in past yr: 1 - -  Injury with Fall? No - -  Follow up Falls evaluation completed - -   Depression Screen [facility] 2/9 Scores 07/08/2016 08/04/2015 05/01/2014  PHQ - 2 Score 0 0 0     Cognitive Testing MMSE - Mini Mental State Exam 07/08/2016  Orientation to time 5  Orientation to Place 5  Registration 3  Attention/ Calculation 0  Recall 3  Language- name 2 objects 0  Language- repeat 1  Language- follow 3 step command 3  Language- read & follow direction 0  Write a sentence 0  Copy design 0  Total score 20   PLEASE NOTE: A Mini-Cog screen was completed. Maximum score is 20. A value of 0 denotes this part of [place] MMSE was not completed or the patient failed this part of the Mini-Cog screening.   Mini-Cog Screening Orientation to Time - Max 5 pts Orientation to Place - Max 5 pts Registration - Max 3 pts Recall - Max 3 pts Language Repeat - Max  1 pts Language Follow 3 Step Command - Max 3 pts  Immunization History  Administered Date(s) Administered  . Influenza Split 11/30/2011, 11/29/2012  . Influenza Whole 09/12/2002, 10/11/2008, 10/07/2009, 10/22/2010  . Influenza,inj,Quad PF,36+ Mos 11/07/2013, 10/22/2015  . Pneumococcal Conjugate-13 05/01/2014  . Pneumococcal Polysaccharide-23 01/11/2012  . Td 10/14/2003, 04/06/2013  . Zoster 07/24/2007   Screening Tests Health Maintenance  Topic Date Due  . Hepatitis C Screening  01/14/2021 (Originally June 03, 1946)  . COLONOSCOPY  07/08/2024 (Originally 06/12/2012)  . INFLUENZA VACCINE  07/13/2016  . COLON CANCER SCREENING ANNUAL FOBT  08/24/2016  . MAMMOGRAM  06/29/2018  . TETANUS/TDAP  04/07/2023  . DEXA SCAN  Completed  . ZOSTAVAX  Completed  . PNA vac Low Risk Adult  Completed      Plan:     I have personally reviewed and addressed the Medicare Annual Wellness questionnaire and have noted the following in the patient's chart:  A. Medical and social history B. Use of alcohol, tobacco or illicit drugs  C. Current medications and supplements D. Functional ability and status E.  Nutritional status F.  Physical activity G. Advance directives H. List of other physicians I.  Hospitalizations, surgeries, and ER visits in previous 12 months J.  [place] to include hearing, vision, cognitive, depression L. Referrals and appointments - none  In addition, I have reviewed and discussed with patient certain preventive protocols, quality metrics, and best practice recommendations. A written personalized care plan for preventive services as well as general preventive health recommendations were provided to patient.  See attached scanned questionnaire for additional information.   Signed,   Lindell Noe, MHA, BS, LPN Health Advisor

## 2016-07-08 NOTE — Progress Notes (Signed)
Pre visit review using our clinic review tool, if applicable. No additional management support is needed unless otherwise documented below in the visit note. 

## 2016-07-08 NOTE — Progress Notes (Signed)
I reviewed health advisor's note, was available for consultation, and agree with documentation and plan.  

## 2016-07-11 ENCOUNTER — Encounter: Payer: Self-pay | Admitting: Family Medicine

## 2016-07-13 ENCOUNTER — Encounter: Payer: Medicare Other | Admitting: Family Medicine

## 2016-07-13 ENCOUNTER — Encounter: Payer: Self-pay | Admitting: *Deleted

## 2016-07-30 ENCOUNTER — Ambulatory Visit (INDEPENDENT_AMBULATORY_CARE_PROVIDER_SITE_OTHER): Payer: Medicare Other | Admitting: Family Medicine

## 2016-07-30 ENCOUNTER — Encounter: Payer: Self-pay | Admitting: Family Medicine

## 2016-07-30 VITALS — BP 112/72 | HR 62 | Temp 98.3°F | Ht 64.0 in | Wt 136.0 lb

## 2016-07-30 DIAGNOSIS — Z0000 Encounter for general adult medical examination without abnormal findings: Secondary | ICD-10-CM | POA: Diagnosis not present

## 2016-07-30 DIAGNOSIS — M810 Age-related osteoporosis without current pathological fracture: Secondary | ICD-10-CM

## 2016-07-30 DIAGNOSIS — E559 Vitamin D deficiency, unspecified: Secondary | ICD-10-CM

## 2016-07-30 DIAGNOSIS — E785 Hyperlipidemia, unspecified: Secondary | ICD-10-CM | POA: Diagnosis not present

## 2016-07-30 MED ORDER — SIMVASTATIN 20 MG PO TABS: 20.0000 mg | ORAL_TABLET | Freq: Every day | ORAL | 3 refills | Status: DC

## 2016-07-30 MED ORDER — MOMETASONE FUROATE 50 MCG/ACT NA SUSP: 2.0000 | Freq: Every day | NASAL | 11 refills | Status: DC

## 2016-07-30 NOTE — Progress Notes (Signed)
Pre visit review using our clinic review tool, if applicable. No additional management support is needed unless otherwise documented below in the visit note. 

## 2016-07-30 NOTE — Progress Notes (Signed)
Subjective:    Patient ID: Kylie Acosta, female    DOB: July 12, 1946, 70 y.o.   MRN: [id number]  HPI Here for health maintenance exam and to review chronic medical problems    Feeling good - training a dog   Wt Readings from Last 3 Encounters:  07/30/16 136 lb (61.7 kg)  07/08/16 134 lb 8 oz (61 kg)  08/04/15 138 lb 12 oz (62.9 kg)  lots of walking  Eats healthy for the most part  bmi of 23.34  Saw Lesia for her AMW visit last month -overall doing well   Has had one fall w/o injury-counseling given -stepped on her dog's leash and fell  Given FOBT kit - has that  - Flu shot- gets in the fall   Did her advance directive- we will put that in the chart   Colonoscopy 7/03 IFOB 9/16-negative  No family hx of colon cancer   Mammogram 7/17-negative  Self breast exam -no lumps or changes -has dense breasts   Dexa 7/17- OP  D level is 49.5 Intolerant of fosamax/bisphosphenates and evista  On ca and D One fall - balance is fair overall  Fracture hx   Cholesterol Lab Results  Component Value Date   CHOL 151 07/08/2016   CHOL 166 07/31/2015   CHOL 178 04/10/2014   Lab Results  Component Value Date   HDL 60.90 07/08/2016   HDL 61.30 07/31/2015   HDL 73.50 04/10/2014   Lab Results  Component Value Date   LDLCALC 78 07/08/2016   LDLCALC 90 07/31/2015   LDLCALC 91 04/10/2014   Lab Results  Component Value Date   TRIG 59.0 07/08/2016   TRIG 75.0 07/31/2015   TRIG 67.0 04/10/2014   Lab Results  Component Value Date   CHOLHDL 2 07/08/2016   CHOLHDL 3 07/31/2015   CHOLHDL 2 04/10/2014   Lab Results  Component Value Date   LDLDIRECT 151.5 09/01/2010   at goal for all indices  Watches her diet and takes simvastatin   Results for orders placed or performed in visit on 07/08/16  HM DEXA SCAN  Result Value Ref Range   HM Dexa Scan osteoporosis range   CBC with Differential/Platelet  Result Value Ref Range   WBC 4.9 4.0 - 10.5 K/uL   RBC 4.08 3.87 - 5.11  Mil/uL   Hemoglobin 12.9 12.0 - 15.0 g/dL   HCT 38.2 36.0 - 46.0 %   MCV 93.7 78.0 - 100.0 fl   MCHC 33.8 30.0 - 36.0 g/dL   RDW 12.1 11.5 - 15.5 %   Platelets 314.0 150.0 - 400.0 K/uL   Neutrophils Relative % 65.2 43.0 - 77.0 %   Lymphocytes Relative 24.7 12.0 - 46.0 %   Monocytes Relative 6.9 3.0 - 12.0 %   Eosinophils Relative 2.7 0.0 - 5.0 %   Basophils Relative 0.5 0.0 - 3.0 %   Neutro Abs 3.2 1.4 - 7.7 K/uL   Lymphs Abs 1.2 0.7 - 4.0 K/uL   Monocytes Absolute 0.3 0.1 - 1.0 K/uL   Eosinophils Absolute 0.1 0.0 - 0.7 K/uL   Basophils Absolute 0.0 0.0 - 0.1 K/uL  Comprehensive metabolic panel  Result Value Ref Range   Sodium 138 135 - 145 mEq/L   Potassium 3.9 3.5 - 5.1 mEq/L   Chloride 102 96 - 112 mEq/L   CO2 31 19 - 32 mEq/L   Glucose, Bld 93 70 - 99 mg/dL   BUN 13 6 - 23 mg/dL   Creatinine,  Ser 0.73 0.40 - 1.20 mg/dL   Total Bilirubin 0.9 0.2 - 1.2 mg/dL   Alkaline Phosphatase 51 39 - 117 U/L   AST 21 0 - 37 U/L   ALT 16 0 - 35 U/L   Total Protein 6.8 6.0 - 8.3 g/dL   Albumin 4.2 3.5 - 5.2 g/dL   Calcium 9.3 8.4 - 10.5 mg/dL   GFR 83.22 >60.00 mL/min  Lipid panel  Result Value Ref Range   Cholesterol 151 0 - 200 mg/dL   Triglycerides 59.0 0.0 - 149.0 mg/dL   HDL 60.90 >39.00 mg/dL   VLDL 11.8 0.0 - 40.0 mg/dL   LDL Cholesterol 78 0 - 99 mg/dL   Total CHOL/HDL Ratio 2    NonHDL 90.01   TSH  Result Value Ref Range   TSH 2.73 0.35 - 4.50 uIU/mL  VITAMIN D 25 Hydroxy (Vit-D Deficiency, Fractures)  Result Value Ref Range   VITD 49.53 30.00 - 100.00 ng/mL     Patient Active Problem List   Diagnosis Date Noted  . Routine general medical examination at a health care facility 07/30/2015  . Vitamin D deficiency 07/30/2015  . Encounter for Medicare annual wellness exam 04/06/2013  . Colon cancer screening 04/06/2013  . GERD 07/07/2010  . VAGINITIS, ATROPHIC 10/15/2009  . Osteoporosis 07/19/2007  . Hyperlipidemia 07/14/2007  . ALLERGIC RHINITIS 07/14/2007  .  ECZEMA 07/14/2007   Past Medical History:  Diagnosis Date  . Allergy    allerlgic rhinitis  . Hyperlipidemia   . Osteopenia    Past Surgical History:  Procedure Laterality Date  . ABDOMINAL HYSTERECTOMY  1997   fibroids   Social History  Substance Use Topics  . Smoking status: Never Smoker  . Smokeless tobacco: Never Used  . Alcohol use No   No family history on file. Allergies  Allergen Reactions  . Alendronate Sodium     REACTION: gerd  . Atorvastatin     REACTION: muscle and joint pain  . Famotidine     REACTION: not effective  . Omeprazole     REACTION: not effective  . Raloxifene     REACTION: hot flashes  . Sulfonamide Derivatives     REACTION: whelps   Current Outpatient Prescriptions on File Prior to Visit  Medication Sig Dispense Refill  . cholecalciferol (VITAMIN D) 1000 UNITS tablet Take 2,000 Units by mouth daily.     Marland Kitchen loratadine-pseudoephedrine (CLARITIN-D 12-HOUR) 5-120 MG per tablet Take 1 tablet by mouth 2 (two) times daily as needed for allergies.     No current facility-administered medications on file prior to visit.     Review of Systems Review of Systems  Constitutional: Negative for fever, appetite change, fatigue and unexpected weight change.  Eyes: Negative for pain and visual disturbance.  Respiratory: Negative for cough and shortness of breath.   Cardiovascular: Negative for cp or palpitations    Gastrointestinal: Negative for nausea, diarrhea and constipation.  Genitourinary: Negative for urgency and frequency.  Skin: Negative for pallor or rash   Neurological: Negative for weakness, light-headedness, numbness and headaches.  Hematological: Negative for adenopathy. Does not bruise/bleed easily.  Psychiatric/Behavioral: Negative for dysphoric mood. The patient is not nervous/anxious.         Objective:   Physical Exam  Constitutional: She appears well-developed and well-nourished. No distress.  Well appearing  HENT:  Head:  Normocephalic and atraumatic.  Right Ear: External ear normal.  Left Ear: External ear normal.  Mouth/Throat: Oropharynx is clear and moist.  Eyes: Conjunctivae and EOM are normal. Pupils are equal, round, and reactive to light. No scleral icterus.  Neck: Normal range of motion. Neck supple. No JVD present. Carotid bruit is not present. No thyromegaly present.  Cardiovascular: Normal rate, regular rhythm, normal heart sounds and intact distal pulses.  Exam reveals no gallop.   Pulmonary/Chest: Effort normal and breath sounds normal. No respiratory distress. She has no wheezes. She exhibits no tenderness.  Abdominal: Soft. Bowel sounds are normal. She exhibits no distension, no abdominal bruit and no mass. There is no tenderness.  Genitourinary: No breast swelling, tenderness, discharge or bleeding.  Genitourinary Comments: Breast exam: No mass, nodules, thickening, tenderness, bulging, retraction, inflamation, nipple discharge or skin changes noted.  No axillary or clavicular LA.      Musculoskeletal: Normal range of motion. She exhibits no edema or tenderness.  No kyphosis   Lymphadenopathy:    She has no cervical adenopathy.  Neurological: She is alert. She has normal reflexes. No cranial nerve deficit. She exhibits normal muscle tone. Coordination normal.  Skin: Skin is warm and dry. No rash noted. No erythema. No pallor.  Some lentigines and few sks   Psychiatric: She has a normal mood and affect.          Assessment & Plan:   Problem List Items Addressed This Visit      Musculoskeletal and Integument   Osteoporosis    Pt has been intolerant of bisphosphenate and evista in the past  No fractures One accidental fall  dexa rev 7/17  Given info on prolia to read - ? If it would be covered, but may be a good option for her  She will let us know if interested       Relevant Medications   Calcium Carbonate-Vit D-Min (CALCIUM 1200 PO)     Other   Vitamin D deficiency     Vitamin D level is therapeutic with current supplementation Disc importance of this to bone and overall health       Routine general medical examination at a health care facility    Reviewed health habits including diet and exercise and skin cancer prevention Reviewed appropriate screening tests for age  Also reviewed health mt list, fam hx and immunization status , as well as social and family history   AMW reviewed  Labs reviewed Get a flu shot this fall  Here is some info on the Prolia injection to consider - let us know if you are interested  Labs look great  Keep taking good care of yoursel      Hyperlipidemia - Primary    Disc goals for lipids and reasons to control them Rev labs with pt Rev low sat fat diet in detail Enc continued good diet and simvastatin       Relevant Medications   simvastatin (ZOCOR) 20 MG tablet    Other Visit Diagnoses   None.

## 2016-07-30 NOTE — Patient Instructions (Addendum)
Get a flu shot this fall  Here is some info on the Prolia injection to consider - let us know if you are interested  Labs look great  Keep taking good care of yourself

## 2016-08-01 NOTE — Assessment & Plan Note (Signed)
Pt has been intolerant of bisphosphenate and evista in the past  No fractures One accidental fall  dexa rev 7/17  Given info on prolia to read - ? If it would be covered, but may be a good option for her  She will let us know if interested

## 2016-08-01 NOTE — Assessment & Plan Note (Signed)
Vitamin D level is therapeutic with current supplementation Disc importance of this to bone and overall health  

## 2016-08-01 NOTE — Assessment & Plan Note (Addendum)
Disc goals for lipids and reasons to control them Rev labs with pt Rev low sat fat diet in detail Enc continued good diet and simvastatin

## 2016-08-01 NOTE — Assessment & Plan Note (Signed)
Reviewed health habits including diet and exercise and skin cancer prevention Reviewed appropriate screening tests for age  Also reviewed health mt list, fam hx and immunization status , as well as social and family history   AMW reviewed  Labs reviewed Get a flu shot this fall  Here is some info on the Prolia injection to consider - let us know if you are interested  Labs look great  Keep taking good care of yoursel

## 2016-09-06 ENCOUNTER — Other Ambulatory Visit (INDEPENDENT_AMBULATORY_CARE_PROVIDER_SITE_OTHER): Payer: Medicare Other

## 2016-09-06 ENCOUNTER — Other Ambulatory Visit: Payer: Self-pay | Admitting: Family Medicine

## 2016-09-06 DIAGNOSIS — Z1211 Encounter for screening for malignant neoplasm of colon: Secondary | ICD-10-CM

## 2016-09-06 LAB — FECAL OCCULT BLOOD, IMMUNOCHEMICAL: Fecal Occult Bld: NEGATIVE

## 2016-09-06 LAB — FECAL OCCULT BLOOD, GUAIAC: Fecal Occult Blood: NEGATIVE

## 2016-09-07 ENCOUNTER — Encounter: Payer: Self-pay | Admitting: *Deleted

## 2016-10-08 ENCOUNTER — Ambulatory Visit (INDEPENDENT_AMBULATORY_CARE_PROVIDER_SITE_OTHER): Payer: Medicare Other

## 2016-10-08 DIAGNOSIS — Z23 Encounter for immunization: Secondary | ICD-10-CM

## 2017-01-18 ENCOUNTER — Ambulatory Visit (INDEPENDENT_AMBULATORY_CARE_PROVIDER_SITE_OTHER): Payer: Medicare Other | Admitting: Podiatry

## 2017-01-18 ENCOUNTER — Ambulatory Visit (INDEPENDENT_AMBULATORY_CARE_PROVIDER_SITE_OTHER): Payer: Medicare Other

## 2017-01-18 DIAGNOSIS — M79672 Pain in left foot: Secondary | ICD-10-CM

## 2017-01-18 DIAGNOSIS — M722 Plantar fascial fibromatosis: Secondary | ICD-10-CM

## 2017-01-18 MED ORDER — MELOXICAM 15 MG PO TABS: 15.0000 mg | ORAL_TABLET | Freq: Every day | ORAL | 0 refills | Status: DC

## 2017-01-24 MED ORDER — BETAMETHASONE SOD PHOS & ACET 6 (3-3) MG/ML IJ SUSP: 3.0000 mg | Freq: Once | INTRAMUSCULAR | Status: DC

## 2017-01-24 NOTE — Progress Notes (Signed)
   Subjective: Patient presents today for pain and tenderness in the left foot. Patient states the foot pain has been hurting for several weeks now. Patient states that it hurts in the mornings with the first steps out of bed. Patient presents today for further treatment and evaluation  Objective: Physical Exam General: The patient is alert and oriented x3 in no acute distress.  Dermatology: Skin is warm, dry and supple bilateral lower extremities. Negative for open lesions or macerations bilateral.   Vascular: Dorsalis Pedis and Posterior Tibial pulses palpable bilateral.  Capillary fill time is immediate to all digits.  Neurological: Epicritic and protective threshold intact bilateral.   Musculoskeletal: Tenderness to palpation at the medial calcaneal tubercale and through the insertion of the plantar fascia of the left foot. All other joints range of motion within normal limits bilateral. Strength 5/5 in all groups bilateral.   Radiographic exam:   Normal osseous mineralization. Joint spaces preserved. No fracture/dislocation/boney destruction. Calcaneal spur present with mild thickening of plantar fascia left. No other soft tissue abnormalities or radiopaque foreign bodies.   Assessment: 1. Plantar fasciitis left foot 2. Pain in left foot  Plan of Care:   1. Patient evaluated. Xrays reviewed.   2. Injection of 0.5cc Celestone soluspan injected into the left plantar fascia.  3. Instructed patient regarding therapies and modalities at home to alleviate symptoms.  4. Rx for meloxicam 15mg  PO given to patient.  5. Plantar fascial band(s) dispensed. 6. Return to clinic in 4 weeks.     Edrick Kins, DPM [facility]  Dr. Edrick Kins, [address]                                        [place], [place] [address]                Office [phone]  Fax [phone]

## 2017-02-15 ENCOUNTER — Encounter: Payer: Self-pay | Admitting: Podiatry

## 2017-02-15 ENCOUNTER — Ambulatory Visit (INDEPENDENT_AMBULATORY_CARE_PROVIDER_SITE_OTHER): Payer: Medicare Other | Admitting: Podiatry

## 2017-02-15 DIAGNOSIS — M722 Plantar fascial fibromatosis: Secondary | ICD-10-CM | POA: Diagnosis not present

## 2017-02-15 DIAGNOSIS — M79605 Pain in left leg: Secondary | ICD-10-CM | POA: Diagnosis not present

## 2017-02-15 DIAGNOSIS — M79672 Pain in left foot: Secondary | ICD-10-CM

## 2017-02-15 DIAGNOSIS — M216X9 Other acquired deformities of unspecified foot: Secondary | ICD-10-CM

## 2017-02-15 MED ORDER — MELOXICAM 15 MG PO TABS: 15.0000 mg | ORAL_TABLET | Freq: Every day | ORAL | 1 refills | Status: DC

## 2017-02-25 MED ORDER — BETAMETHASONE SOD PHOS & ACET 6 (3-3) MG/ML IJ SUSP: 3.0000 mg | Freq: Once | INTRAMUSCULAR | Status: DC

## 2017-02-25 NOTE — Progress Notes (Signed)
   Subjective: Patient presents today for follow-up evaluation of plantar fasciitis to the left foot. Patient states that she is still not well. She states that the injection did help last visit. Patient states the plantar fascial brace has not helped. Patient presents today for further treatment and evaluation  Objective: Physical Exam General: The patient is alert and oriented x3 in no acute distress.  Dermatology: Skin is warm, dry and supple bilateral lower extremities. Negative for open lesions or macerations bilateral.   Vascular: Dorsalis Pedis and Posterior Tibial pulses palpable bilateral.  Capillary fill time is immediate to all digits.  Neurological: Epicritic and protective threshold intact bilateral.   Musculoskeletal: Tenderness to palpation at the medial calcaneal tubercale and through the insertion of the plantar fascia of the left foot. All other joints range of motion within normal limits bilateral. Strength 5/5 in all groups bilateral.   Radiographic exam:   Normal osseous mineralization. Joint spaces preserved. No fracture/dislocation/boney destruction. Calcaneal spur present with mild thickening of plantar fascia left. No other soft tissue abnormalities or radiopaque foreign bodies.   Assessment: 1. Plantar fasciitis left foot 2. Pain in left foot  Plan of Care:   1. Patient was evaluated today. 2. Injection of 0.5 mL Celestone Soluspan injected into the patient's plantar fascia left foot. 3. Recommend over-the-counter insoles 4. Prescription for meloxicam 15 mg 5. Return to clinic in 4 weeks  Patient is going to [place] for Easter where her son lives.   Br Debbrah Alar, DPM [facility]  Dr. Edrick Kins, [address]                                        [place], [place] [address]                Office [phone]  Fax [phone]

## 2017-06-30 ENCOUNTER — Encounter: Payer: Self-pay | Admitting: Family Medicine

## 2017-06-30 ENCOUNTER — Ambulatory Visit (INDEPENDENT_AMBULATORY_CARE_PROVIDER_SITE_OTHER): Payer: Medicare Other | Admitting: Family Medicine

## 2017-06-30 DIAGNOSIS — R21 Rash and other nonspecific skin eruption: Secondary | ICD-10-CM | POA: Diagnosis not present

## 2017-06-30 MED ORDER — TRIAMCINOLONE ACETONIDE 0.1 % EX CREA: 1.0000 "application " | TOPICAL_CREAM | Freq: Two times a day (BID) | CUTANEOUS | 0 refills | Status: DC

## 2017-06-30 NOTE — Patient Instructions (Signed)
Great to meet you.  Let's try Claritin every morning and a benadryl at bedtime if you're still itchy for the next several days.  Triamcinolone to the areas twice daily for no longer than 10 days without updating [place].  Please keep me updated.

## 2017-06-30 NOTE — Assessment & Plan Note (Signed)
New- consistent with allergic dermatitis. Advised to d/c benadryl cream. eRx sent for topical triamcinolone to use twice daily and to add oral antihistamines- once or twice daily. Call or return to clinic prn if these symptoms worsen or fail to improve as anticipated. The patient indicates understanding of these issues and agrees with the plan.

## 2017-06-30 NOTE — Progress Notes (Signed)
Subjective:   Patient ID: Kylie Acosta, female    DOB: 02-24-1946, 71 y.o.   MRN: [id number]  Kylie Acosta is a pleasant 71 y.o. year old female patient of Dr.Tower, new to me,  who presents to clinic today with Rash  on 06/30/2017  HPI:  Rash- had multiple mosquito bites last week. Applied topical bendaryl but it seemed the more she applied, the more irritated and itchy her skin became and now she has a confluent itchy rash on her chest and abdomen.  Now applying OTC hydrocortisone cream which has helped some with the itching.  Current Outpatient Prescriptions on File Prior to Visit  Medication Sig Dispense Refill  . Calcium Carbonate-Vit D-Min (CALCIUM 1200 PO) Take 1 capsule by mouth daily.    . cholecalciferol (VITAMIN D) 1000 UNITS tablet Take 2,000 Units by mouth daily.     . Multiple Vitamin (MULTIVITAMIN) capsule Take 1 capsule by mouth daily.    . simvastatin (ZOCOR) 20 MG tablet Take 1 tablet (20 mg total) by mouth daily. 90 tablet 3  . tobramycin-dexamethasone (TOBRADEX) ophthalmic solution SHAKE LQ AND INT 1 GTT IN OU QID FOR 7 TO 10 DAYS  1   Current Facility-Administered Medications on File Prior to Visit  Medication Dose Route Frequency Provider Last Rate Last Dose  . betamethasone acetate-betamethasone sodium phosphate (CELESTONE) injection 3 mg  3 mg Intramuscular Once Daylene Katayama M, DPM      . betamethasone acetate-betamethasone sodium phosphate (CELESTONE) injection 3 mg  3 mg Intramuscular Once Edrick Kins, DPM        Allergies  Allergen Reactions  . Alendronate Sodium     REACTION: gerd  . Atorvastatin     REACTION: muscle and joint pain  . Famotidine     REACTION: not effective  . Omeprazole     REACTION: not effective  . Raloxifene     REACTION: hot flashes  . Sulfonamide Derivatives     REACTION: whelps    Past Medical History:  Diagnosis Date  . Allergy    allerlgic rhinitis  . Hyperlipidemia   . Osteopenia     Past Surgical History:    Procedure Laterality Date  . ABDOMINAL HYSTERECTOMY  1997   fibroids    No family history on file.  Social History   Social History  . Marital status: Married    Spouse name: N/A  . Number of children: N/A  . Years of education: N/A   Occupational History  . Not on file.   Social History Main Topics  . Smoking status: Never Smoker  . Smokeless tobacco: Never Used  . Alcohol use No  . Drug use: No  . Sexual activity: No   Other Topics Concern  . Not on file   Social History Narrative  . No narrative on file   The PMH, PSH, Social History, Family History, Medications, and allergies have been reviewed in [facility], and have been updated if relevant.   Review of Systems  Constitutional: Negative.   Skin: Positive for rash.  All other systems reviewed and are negative.      Objective:    BP 120/80   Pulse 69   Temp 97.9 F (36.6 C)   Ht 5\' 4"  (1.626 m)   Wt 140 lb (63.5 kg)   SpO2 97%   BMI 24.03 kg/m    Physical Exam  Constitutional: She is oriented to person, place, and time. She appears well-developed and well-nourished. No distress.  HENT:  Head: Normocephalic and atraumatic.  Eyes: Conjunctivae are normal.  Cardiovascular: Normal rate.   Pulmonary/Chest: Effort normal.  Musculoskeletal: Normal range of motion. She exhibits no edema.  Neurological: She is alert and oriented to person, place, and time. No cranial nerve deficit.  Skin: She is not diaphoretic.     Psychiatric: She has a normal mood and affect. Her behavior is normal. Judgment and thought content normal.  Nursing note and vitals reviewed.         Assessment & Plan:   Rash and nonspecific skin eruption No Follow-up on file.

## 2017-07-18 ENCOUNTER — Encounter: Payer: Self-pay | Admitting: Family Medicine

## 2017-08-01 ENCOUNTER — Telehealth: Payer: Self-pay | Admitting: Family Medicine

## 2017-08-01 DIAGNOSIS — E7800 Pure hypercholesterolemia, unspecified: Secondary | ICD-10-CM

## 2017-08-01 DIAGNOSIS — E559 Vitamin D deficiency, unspecified: Secondary | ICD-10-CM

## 2017-08-01 DIAGNOSIS — Z0000 Encounter for general adult medical examination without abnormal findings: Secondary | ICD-10-CM

## 2017-08-01 NOTE — Telephone Encounter (Signed)
-----   Message from Ellamae Sia sent at 08/01/2017  9:10 AM EDT ----- Regarding: Lab orders for Tuesday, 8.21.18 Patient is scheduled for CPX labs, please order future labs, Thanks , Karna Christmas

## 2017-08-09 ENCOUNTER — Other Ambulatory Visit: Payer: Medicare Other

## 2017-08-09 ENCOUNTER — Ambulatory Visit (INDEPENDENT_AMBULATORY_CARE_PROVIDER_SITE_OTHER): Payer: Medicare Other

## 2017-08-09 VITALS — BP 116/74 | HR 80 | Temp 97.5°F | Ht 63.75 in | Wt 133.8 lb

## 2017-08-09 DIAGNOSIS — Z1159 Encounter for screening for other viral diseases: Secondary | ICD-10-CM | POA: Diagnosis not present

## 2017-08-09 DIAGNOSIS — Z0000 Encounter for general adult medical examination without abnormal findings: Secondary | ICD-10-CM | POA: Diagnosis not present

## 2017-08-09 DIAGNOSIS — E559 Vitamin D deficiency, unspecified: Secondary | ICD-10-CM

## 2017-08-09 DIAGNOSIS — E7800 Pure hypercholesterolemia, unspecified: Secondary | ICD-10-CM | POA: Diagnosis not present

## 2017-08-09 LAB — COMPREHENSIVE METABOLIC PANEL
ALT: 16 U/L (ref 0–35)
AST: 23 U/L (ref 0–37)
Albumin: 4.5 g/dL (ref 3.5–5.2)
Alkaline Phosphatase: 42 U/L (ref 39–117)
BILIRUBIN TOTAL: 0.9 mg/dL (ref 0.2–1.2)
BUN: 11 mg/dL (ref 6–23)
CHLORIDE: 99 meq/L (ref 96–112)
CO2: 31 meq/L (ref 19–32)
Calcium: 9.6 mg/dL (ref 8.4–10.5)
Creatinine, Ser: 0.77 mg/dL (ref 0.40–1.20)
GFR: 78.01 mL/min (ref >60.00)
GLUCOSE: 87 mg/dL (ref 70–99)
Potassium: 4.1 mEq/L (ref 3.5–5.1)
SODIUM: 136 meq/L (ref 135–145)
Total Protein: 7 g/dL (ref 6.0–8.3)

## 2017-08-09 LAB — CBC WITH DIFFERENTIAL/PLATELET
BASOS ABS: 0 10*3/uL (ref 0.0–0.1)
BASOS PCT: 0.6 % (ref 0.0–3.0)
EOS ABS: 0.1 10*3/uL (ref 0.0–0.7)
Eosinophils Relative: 1.1 % (ref 0.0–5.0)
HEMATOCRIT: 40 % (ref 36.0–46.0)
Hemoglobin: 13.7 g/dL (ref 12.0–15.0)
LYMPHS ABS: 1.5 10*3/uL (ref 0.7–4.0)
Lymphocytes Relative: 23.4 % (ref 12.0–46.0)
MCHC: 34.1 g/dL (ref 30.0–36.0)
MCV: 95.9 fl (ref 78.0–100.0)
Monocytes Absolute: 0.5 10*3/uL (ref 0.1–1.0)
Monocytes Relative: 7.5 % (ref 3.0–12.0)
NEUTROS ABS: 4.4 10*3/uL (ref 1.4–7.7)
NEUTROS PCT: 67.4 % (ref 43.0–77.0)
PLATELETS: 261 10*3/uL (ref 150.0–400.0)
RBC: 4.18 Mil/uL (ref 3.87–5.11)
RDW: 12.2 % (ref 11.5–15.5)
WBC: 6.5 10*3/uL (ref 4.0–10.5)

## 2017-08-09 LAB — TSH: TSH: 2.91 u[IU]/mL (ref 0.35–4.50)

## 2017-08-09 LAB — LIPID PANEL
CHOL/HDL RATIO: 3
Cholesterol: 171 mg/dL (ref 0–200)
HDL: 67.1 mg/dL (ref >39.00)
LDL Cholesterol: 93 mg/dL (ref 0–99)
NONHDL: 104.24
TRIGLYCERIDES: 57 mg/dL (ref 0.0–149.0)
VLDL: 11.4 mg/dL (ref 0.0–40.0)

## 2017-08-09 LAB — VITAMIN D 25 HYDROXY (VIT D DEFICIENCY, FRACTURES): VITD: 51.21 ng/mL (ref 30.00–100.00)

## 2017-08-09 NOTE — Patient Instructions (Signed)
Kylie Acosta , Thank you for taking time to come for your Medicare Wellness Visit. I appreciate your ongoing commitment to your health goals. Please review the following plan we discussed and let me know if I can assist you in the future.   These are the goals we discussed: Goals    . Increase physical activity          Starting 08/09/2017, I will continue to walk at least 30 min daily.        This is a list of the screening recommended for you and due dates:  Health Maintenance  Topic Date Due  . Flu Shot  03/12/2018*  . Stool Blood Test  09/06/2017  . Mammogram  07/19/2019  . Tetanus Vaccine  04/07/2023  . DEXA scan (bone density measurement)  Completed  .  Hepatitis C: One time screening is recommended by Center for Disease Control  (CDC) for  adults born from [birth year] through 1967.   Completed  . Pneumonia vaccines  Completed  *Topic was postponed. The date shown is not the original due date.   Preventive Care for Adults  A healthy lifestyle and preventive care can promote health and wellness. Preventive health guidelines for adults include the following key practices.  . A routine yearly physical is a good way to check with your health care provider about your health and preventive screening. It is a chance to share any concerns and updates on your health and to receive a thorough exam.  . Visit your dentist for a routine exam and preventive care every 6 months. Brush your teeth twice a day and floss once a day. Good oral hygiene prevents tooth decay and gum disease.  . The frequency of eye exams is based on your age, health, family medical history, use  of contact lenses, and other factors. Follow your health care provider's ecommendations for frequency of eye exams.  . Eat a healthy diet. Foods like vegetables, fruits, whole grains, low-fat dairy products, and lean protein foods contain the nutrients you need without too many calories. Decrease your intake of foods high in solid  fats, added sugars, and salt. Eat the right amount of calories for you. Get information about a proper diet from your health care provider, if necessary.  . Regular physical exercise is one of the most important things you can do for your health. Most adults should get at least 150 minutes of moderate-intensity exercise (any activity that increases your heart rate and causes you to sweat) each week. In addition, most adults need muscle-strengthening exercises on 2 or more days a week.  Silver Sneakers may be a benefit available to you. To determine eligibility, you may visit the website: www.silversneakers.com or contact program at 540-161-5390 Mon-Fri between 8AM-8PM.   . Maintain a healthy weight. The body mass index (BMI) is a screening tool to identify possible weight problems. It provides an estimate of body fat based on height and weight. Your health care provider can find your BMI and can help you achieve or maintain a healthy weight.   For adults 20 years and older: ? A BMI below 18.5 is considered underweight. ? A BMI of 18.5 to 24.9 is normal. ? A BMI of 25 to 29.9 is considered overweight. ? A BMI of 30 and above is considered obese.   . Maintain normal blood lipids and cholesterol levels by exercising and minimizing your intake of saturated fat. Eat a balanced diet with plenty of fruit and vegetables. Blood  tests for lipids and cholesterol should begin at age 56 and be repeated every 5 years. If your lipid or cholesterol levels are high, you are over 50, or you are at high risk for heart disease, you may need your cholesterol levels checked more frequently. Ongoing high lipid and cholesterol levels should be treated with medicines if diet and exercise are not working.  . If you smoke, find out from your health care provider how to quit. If you do not use tobacco, please do not start.  . If you choose to drink alcohol, please do not consume more than 2 drinks per day. One drink is  considered to be 12 ounces (355 mL) of beer, 5 ounces (148 mL) of wine, or 1.5 ounces (44 mL) of liquor.  . If you are 17-31 years old, ask your health care provider if you should take aspirin to prevent strokes.  . Use sunscreen. Apply sunscreen liberally and repeatedly throughout the day. You should seek shade when your shadow is shorter than you. Protect yourself by wearing long sleeves, pants, a wide-brimmed hat, and sunglasses year round, whenever you are outdoors.  . Once a month, do a whole body skin exam, using a mirror to look at the skin on your back. Tell your health care provider of new moles, moles that have irregular borders, moles that are larger than a pencil eraser, or moles that have changed in shape or color.

## 2017-08-09 NOTE — Progress Notes (Signed)
Subjective:   Kylie Acosta is a 71 y.o. female who presents for Medicare Annual (Subsequent) preventive examination.  Review of Systems:  N/A Cardiac Risk Factors include: advanced age (>56men, >82 women);dyslipidemia     Objective:     Vitals: BP 116/74 (BP Location: Right Arm, Patient Position: Sitting, Cuff Size: Normal)   Pulse 80   Temp (!) 97.5 F (36.4 C) (Oral)   Ht 5' 3.75" (1.619 m) Comment: no shoes  Wt 133 lb 12 oz (60.7 kg)   SpO2 96%   BMI 23.14 kg/m   Body mass index is 23.14 kg/m.   Tobacco History  Smoking Status  . Never Smoker  Smokeless Tobacco  . Never Used     Counseling given: No   Past Medical History:  Diagnosis Date  . Allergy    allerlgic rhinitis  . Hyperlipidemia   . Osteopenia    Past Surgical History:  Procedure Laterality Date  . ABDOMINAL HYSTERECTOMY  1997   fibroids   History reviewed. No pertinent family history. History  Sexual Activity  . Sexual activity: No    Outpatient Encounter Prescriptions as of 08/09/2017  Medication Sig  . Calcium Carbonate-Vit D-Min (CALCIUM 1200 PO) Take 1 capsule by mouth daily.  . cholecalciferol (VITAMIN D) 1000 UNITS tablet Take 2,000 Units by mouth daily.   . Multiple Vitamin (MULTIVITAMIN) capsule Take 1 capsule by mouth daily.  . simvastatin (ZOCOR) 20 MG tablet Take 1 tablet (20 mg total) by mouth daily.  . [DISCONTINUED] tobramycin-dexamethasone (TOBRADEX) ophthalmic solution SHAKE LQ AND INT 1 GTT IN OU QID FOR 7 TO 10 DAYS  . [DISCONTINUED] triamcinolone cream (KENALOG) 0.1 % Apply 1 application topically 2 (two) times daily.   Facility-Administered Encounter Medications as of 08/09/2017  Medication  . betamethasone acetate-betamethasone sodium phosphate (CELESTONE) injection 3 mg  . betamethasone acetate-betamethasone sodium phosphate (CELESTONE) injection 3 mg    Activities of Daily Living In your present state of health, do you have any difficulty performing the  following activities: 08/09/2017  Hearing? N  Vision? N  Difficulty concentrating or making decisions? N  Walking or climbing stairs? N  Dressing or bathing? N  Doing errands, shopping? N  Preparing Food and eating ? N  Using the Toilet? N  In the past six months, have you accidently leaked urine? N  Do you have problems with loss of bowel control? N  Managing your Medications? N  Managing your Finances? N  Housekeeping or managing your Housekeeping? N  Some recent data might be hidden    Patient Care Team: Tower, Wynelle Fanny, MD as PCP - General Ralene Bathe, MD as Referring Physician (Dermatology) Garfield Cornea, OD as Referring Physician (Optometry)    Assessment:     Hearing Screening   125Hz  250Hz  500Hz  1000Hz  2000Hz  3000Hz  4000Hz  6000Hz  8000Hz   Right ear:   40 0 40  40    Left ear:   0 0 40  0      Visual Acuity Screening   Right eye Left eye Both eyes  Without correction:     With correction: 20/30-1 20/50 20/40     Exercise Activities and Dietary recommendations Current Exercise Habits: Home exercise routine, Type of exercise: walking, Time (Minutes): 30, Frequency (Times/Week): 7, Weekly Exercise (Minutes/Week): 210, Intensity: Mild, Exercise limited by: None identified  Goals    . Increase physical activity          Starting 08/09/2017, I will continue to walk at least 30  min daily.       Fall Risk Fall Risk  08/09/2017 07/08/2016 08/04/2015 05/01/2014  Falls in the past year? No Yes No No  Number falls in past yr: - 1 - -  Injury with Fall? - No - -  Follow up - Falls evaluation completed - -   Depression Screen PHQ 2/9 Scores 08/09/2017 07/08/2016 08/04/2015 05/01/2014  PHQ - 2 Score 0 0 0 0  PHQ- 9 Score 0 - - -     Cognitive Function MMSE - Mini Mental State Exam 08/09/2017 07/08/2016  Orientation to time 5 5  Orientation to Place 5 5  Registration 3 3  Attention/ Calculation 0 0  Recall 3 3  Language- name 2 objects 0 0  Language- repeat 1 1    Language- follow 3 step command 3 3  Language- read & follow direction 0 0  Write a sentence 0 0  Copy design 0 0  Total score 20 20        Immunization History  Administered Date(s) Administered  . Influenza Split 11/30/2011, 11/29/2012  . Influenza Whole 09/12/2002, 10/11/2008, 10/07/2009, 10/22/2010  . Influenza,inj,Quad PF,6+ Mos 11/07/2013, 10/22/2015, 10/08/2016  . Pneumococcal Conjugate-13 05/01/2014  . Pneumococcal Polysaccharide-23 01/11/2012  . Td 10/14/2003, 04/06/2013  . Zoster 07/24/2007   Screening Tests Health Maintenance  Topic Date Due  . INFLUENZA VACCINE  03/12/2018 (Originally 07/13/2017)  . COLON CANCER SCREENING ANNUAL FOBT  09/06/2017  . MAMMOGRAM  07/19/2019  . TETANUS/TDAP  04/07/2023  . DEXA SCAN  Completed  . Hepatitis C Screening  Completed  . PNA vac Low Risk Adult  Completed      Plan:     I have personally reviewed and addressed the Medicare Annual Wellness questionnaire and have noted the following in the patient's chart:  A. Medical and social history B. Use of alcohol, tobacco or illicit drugs  C. Current medications and supplements D. Functional ability and status E.  Nutritional status F.  Physical activity G. Advance directives H. List of other physicians I.  Hospitalizations, surgeries, and ER visits in previous 12 months J.  [place] to include hearing, vision, cognitive, depression L. Referrals and appointments - none  In addition, I have reviewed and discussed with patient certain preventive protocols, quality metrics, and best practice recommendations. A written personalized care plan for preventive services as well as general preventive health recommendations were provided to patient.  See attached scanned questionnaire for additional information.   Signed,   Lindell Noe, MHA, BS, LPN Health Coach   Lindell Noe, [place]  [id number]

## 2017-08-09 NOTE — Progress Notes (Signed)
Pre visit review using our clinic review tool, if applicable. No additional management support is needed unless otherwise documented below in the visit note. 

## 2017-08-09 NOTE — Progress Notes (Signed)
PCP notes:   Health maintenance:  Hep C screening - completed Flu vaccine - addressed  Abnormal screenings:   Hearing - failed   Patient concerns:   None  Nurse concerns:  None  Next PCP appt:   08/12/17 @ 0930  I reviewed health advisor's note, was available for consultation, and agree with documentation and plan. Loura Pardon MD

## 2017-08-10 LAB — HEPATITIS C ANTIBODY: HCV AB: NONREACTIVE

## 2017-08-12 ENCOUNTER — Encounter: Payer: Self-pay | Admitting: Family Medicine

## 2017-08-12 ENCOUNTER — Ambulatory Visit (INDEPENDENT_AMBULATORY_CARE_PROVIDER_SITE_OTHER): Payer: Medicare Other | Admitting: Family Medicine

## 2017-08-12 VITALS — BP 122/68 | HR 70 | Temp 98.2°F | Ht 63.75 in | Wt 134.5 lb

## 2017-08-12 DIAGNOSIS — E7800 Pure hypercholesterolemia, unspecified: Secondary | ICD-10-CM | POA: Diagnosis not present

## 2017-08-12 DIAGNOSIS — Z1211 Encounter for screening for malignant neoplasm of colon: Secondary | ICD-10-CM

## 2017-08-12 DIAGNOSIS — E559 Vitamin D deficiency, unspecified: Secondary | ICD-10-CM

## 2017-08-12 DIAGNOSIS — Z0000 Encounter for general adult medical examination without abnormal findings: Secondary | ICD-10-CM

## 2017-08-12 DIAGNOSIS — M810 Age-related osteoporosis without current pathological fracture: Secondary | ICD-10-CM | POA: Diagnosis not present

## 2017-08-12 MED ORDER — SIMVASTATIN 20 MG PO TABS: 20.0000 mg | ORAL_TABLET | Freq: Every day | ORAL | 3 refills | Status: DC

## 2017-08-12 NOTE — Assessment & Plan Note (Signed)
Disc goals for lipids and reasons to control them Rev labs with pt Rev low sat fat diet in detail Good control with simvastatin and diet

## 2017-08-12 NOTE — Assessment & Plan Note (Signed)
Intol of bisphosphenate and evista  Not interested in prolia (discussed) No falls or fx  D level is good  Good habits/exercise  Disc fall prev Due dexa 1 y

## 2017-08-12 NOTE — Patient Instructions (Addendum)
Don't forget to get your flu shot in the fall  We will sign you up for cologuard program for screening  Take care of yourself  Stay active

## 2017-08-12 NOTE — Assessment & Plan Note (Signed)
Reviewed health habits including diet and exercise and skin cancer prevention Reviewed appropriate screening tests for age  Also reviewed health mt list, fam hx and immunization status , as well as social and family history   See HPI amw rev  Labs rev  Good cholesterol control  Will do cologuard program for colon cancer screening  Will get flu shot in the fall  Enc good health habits

## 2017-08-12 NOTE — Assessment & Plan Note (Signed)
Declines colonoscopy Signed up for cologuard program

## 2017-08-12 NOTE — Assessment & Plan Note (Signed)
Vitamin D level is therapeutic with current supplementation Disc importance of this to bone and overall health Level in 50s 

## 2017-08-12 NOTE — Progress Notes (Signed)
Subjective:    Patient ID: Kylie Acosta, female    DOB: 07-16-1946, 71 y.o.   MRN: [id number]  HPI  Here for health maintenance exam and to review chronic medical problems    Feeling good overall no c/o   Had amw visit 8/28 Hearing - missed tones more in L ear She and her husband wrestle with hearing issues/ not ready for hearing aide    Wt Readings from Last 3 Encounters:  08/12/17 134 lb 8 oz (61 kg)  08/09/17 133 lb 12 oz (60.7 kg)  06/30/17 140 lb (63.5 kg)  wt is stable  23.27 kg/m  Flu shot -will get in the fall  Colon cancer screening 9/17 ifob neg  Declines colonoscopy  Will do cologarud   Mammogram 8/18-nl  Self breast exam - no lumps or changes   dexa 7/17- OP  D level is 51 Intolerant to bisphosphenate and evista in the past  Given info on prolia to read last time  She is not interested in it so far No falls or fx this year   Neg Hep C screen this month   Hyperlipidemia  Lab Results  Component Value Date   CHOL 171 08/09/2017   CHOL 151 07/08/2016   CHOL 166 07/31/2015   Lab Results  Component Value Date   HDL 67.10 08/09/2017   HDL 60.90 07/08/2016   HDL 61.30 07/31/2015   Lab Results  Component Value Date   LDLCALC 93 08/09/2017   LDLCALC 78 07/08/2016   LDLCALC 90 07/31/2015   Lab Results  Component Value Date   TRIG 57.0 08/09/2017   TRIG 59.0 07/08/2016   TRIG 75.0 07/31/2015   Lab Results  Component Value Date   CHOLHDL 3 08/09/2017   CHOLHDL 2 07/08/2016   CHOLHDL 3 07/31/2015   Lab Results  Component Value Date   LDLDIRECT 151.5 09/01/2010   On simvastatin and diet  Eats a healthy diet  Stable and well controlled   Results for orders placed or performed in visit on 08/09/17  Comprehensive metabolic panel  Result Value Ref Range   Sodium 136 135 - 145 mEq/L   Potassium 4.1 3.5 - 5.1 mEq/L   Chloride 99 96 - 112 mEq/L   CO2 31 19 - 32 mEq/L   Glucose, Bld 87 70 - 99 mg/dL   BUN 11 6 - 23 mg/dL   Creatinine,  Ser 0.77 0.40 - 1.20 mg/dL   Total Bilirubin 0.9 0.2 - 1.2 mg/dL   Alkaline Phosphatase 42 39 - 117 U/L   AST 23 0 - 37 U/L   ALT 16 0 - 35 U/L   Total Protein 7.0 6.0 - 8.3 g/dL   Albumin 4.5 3.5 - 5.2 g/dL   Calcium 9.6 8.4 - 10.5 mg/dL   GFR 78.01 >60.00 mL/min  CBC with Differential/Platelet  Result Value Ref Range   WBC 6.5 4.0 - 10.5 K/uL   RBC 4.18 3.87 - 5.11 Mil/uL   Hemoglobin 13.7 12.0 - 15.0 g/dL   HCT 40.0 36.0 - 46.0 %   MCV 95.9 78.0 - 100.0 fl   MCHC 34.1 30.0 - 36.0 g/dL   RDW 12.2 11.5 - 15.5 %   Platelets 261.0 150.0 - 400.0 K/uL   Neutrophils Relative % 67.4 43.0 - 77.0 %   Lymphocytes Relative 23.4 12.0 - 46.0 %   Monocytes Relative 7.5 3.0 - 12.0 %   Eosinophils Relative 1.1 0.0 - 5.0 %   Basophils Relative 0.6 0.0 -  3.0 %   Neutro Abs 4.4 1.4 - 7.7 K/uL   Lymphs Abs 1.5 0.7 - 4.0 K/uL   Monocytes Absolute 0.5 0.1 - 1.0 K/uL   Eosinophils Absolute 0.1 0.0 - 0.7 K/uL   Basophils Absolute 0.0 0.0 - 0.1 K/uL  Lipid panel  Result Value Ref Range   Cholesterol 171 0 - 200 mg/dL   Triglycerides 57.0 0.0 - 149.0 mg/dL   HDL 67.10 >39.00 mg/dL   VLDL 11.4 0.0 - 40.0 mg/dL   LDL Cholesterol 93 0 - 99 mg/dL   Total CHOL/HDL Ratio 3    NonHDL 104.24   TSH  Result Value Ref Range   TSH 2.91 0.35 - 4.50 uIU/mL  VITAMIN D 25 Hydroxy (Vit-D Deficiency, Fractures)  Result Value Ref Range   VITD 51.21 30.00 - 100.00 ng/mL  Hepatitis C antibody  Result Value Ref Range   HCV Ab NON-REACTIVE NON-REACTIVE    Patient Active Problem List   Diagnosis Date Noted  . Routine general medical examination at a health care facility 07/30/2015  . Vitamin D deficiency 07/30/2015  . Encounter for Medicare annual wellness exam 04/06/2013  . Colon cancer screening 04/06/2013  . GERD 07/07/2010  . VAGINITIS, ATROPHIC 10/15/2009  . Osteoporosis 07/19/2007  . Hyperlipidemia 07/14/2007  . ALLERGIC RHINITIS 07/14/2007  . ECZEMA 07/14/2007   Past Medical History:  Diagnosis  Date  . Allergy    allerlgic rhinitis  . Hyperlipidemia   . Osteopenia    Past Surgical History:  Procedure Laterality Date  . ABDOMINAL HYSTERECTOMY  1997   fibroids   Social History  Substance Use Topics  . Smoking status: Never Smoker  . Smokeless tobacco: Never Used  . Alcohol use No   No family history on file. Allergies  Allergen Reactions  . Benadryl [Diphenhydramine] Itching    Severe itching. Allergic reaction to cream.  . Alendronate Sodium     REACTION: gerd  . Atorvastatin     REACTION: muscle and joint pain  . Famotidine     REACTION: not effective  . Omeprazole     REACTION: not effective  . Raloxifene     REACTION: hot flashes  . Sulfonamide Derivatives     REACTION: whelps   Current Outpatient Prescriptions on File Prior to Visit  Medication Sig Dispense Refill  . Calcium Carbonate-Vit D-Min (CALCIUM 1200 PO) Take 1 capsule by mouth daily.    . cholecalciferol (VITAMIN D) 1000 UNITS tablet Take 2,000 Units by mouth daily.     . Multiple Vitamin (MULTIVITAMIN) capsule Take 1 capsule by mouth daily.     Current Facility-Administered Medications on File Prior to Visit  Medication Dose Route Frequency Provider Last Rate Last Dose  . betamethasone acetate-betamethasone sodium phosphate (CELESTONE) injection 3 mg  3 mg Intramuscular Once Daylene Katayama M, DPM      . betamethasone acetate-betamethasone sodium phosphate (CELESTONE) injection 3 mg  3 mg Intramuscular Once Edrick Kins, DPM        Review of Systems    Review of Systems  Constitutional: Negative for fever, appetite change, fatigue and unexpected weight change.  Eyes: Negative for pain and visual disturbance.  ENT pos for mild hearing loss  Respiratory: Negative for cough and shortness of breath.   Cardiovascular: Negative for cp or palpitations    Gastrointestinal: Negative for nausea, diarrhea and constipation.  Genitourinary: Negative for urgency and frequency.  Skin: Negative for pallor  or rash   Neurological: Negative for weakness, light-headedness, numbness  and headaches.  Hematological: Negative for adenopathy. Does not bruise/bleed easily.  Psychiatric/Behavioral: Negative for dysphoric mood. The patient is not nervous/anxious.      Objective:   Physical Exam  Constitutional: She appears well-developed and well-nourished. No distress.  Well appearing   HENT:  Head: Normocephalic and atraumatic.  Right Ear: External ear normal.  Left Ear: External ear normal.  Mouth/Throat: Oropharynx is clear and moist.  Eyes: Pupils are equal, round, and reactive to light. Conjunctivae and EOM are normal. No scleral icterus.  Neck: Normal range of motion. Neck supple. No JVD present. Carotid bruit is not present. No thyromegaly present.  Cardiovascular: Normal rate, regular rhythm, normal heart sounds and intact distal pulses.  Exam reveals no gallop.   Pulmonary/Chest: Effort normal and breath sounds normal. No respiratory distress. She has no wheezes. She exhibits no tenderness.  Abdominal: Soft. Bowel sounds are normal. She exhibits no distension, no abdominal bruit and no mass. There is no tenderness.  Genitourinary: No breast swelling, tenderness, discharge or bleeding.  Genitourinary Comments: Breast exam: No mass, nodules, thickening, tenderness, bulging, retraction, inflamation, nipple discharge or skin changes noted.  No axillary or clavicular LA.      Musculoskeletal: Normal range of motion. She exhibits no edema or tenderness.  Lymphadenopathy:    She has no cervical adenopathy.  Neurological: She is alert. She has normal reflexes. No cranial nerve deficit. She exhibits normal muscle tone. Coordination normal.  Skin: Skin is warm and dry. No rash noted. No erythema. No pallor.  Fair complexion  Few lentigines  Psychiatric: She has a normal mood and affect.  Pleasant and talkative Mentally sharp          Assessment & Plan:   Problem List Items Addressed This  Visit      Musculoskeletal and Integument   Osteoporosis - Primary    Intol of bisphosphenate and evista  Not interested in prolia (discussed) No falls or fx  D level is good  Good habits/exercise  Disc fall prev Due dexa 1 y        Other   Colon cancer screening    Declines colonoscopy Signed up for cologuard program       Hyperlipidemia    Disc goals for lipids and reasons to control them Rev labs with pt Rev low sat fat diet in detail Good control with simvastatin and diet       Relevant Medications   simvastatin (ZOCOR) 20 MG tablet   Routine general medical examination at a health care facility    Reviewed health habits including diet and exercise and skin cancer prevention Reviewed appropriate screening tests for age  Also reviewed health mt list, fam hx and immunization status , as well as social and family history   See HPI amw rev  Labs rev  Good cholesterol control  Will do cologuard program for colon cancer screening  Will get flu shot in the fall  Enc good health habits       Vitamin D deficiency    Vitamin D level is therapeutic with current supplementation Disc importance of this to bone and overall health Level in 27s

## 2017-08-31 ENCOUNTER — Encounter: Payer: Self-pay | Admitting: Family Medicine

## 2017-08-31 LAB — COLOGUARD: Cologuard: POSITIVE

## 2017-09-12 ENCOUNTER — Telehealth: Payer: Self-pay | Admitting: *Deleted

## 2017-09-12 DIAGNOSIS — R195 Other fecal abnormalities: Secondary | ICD-10-CM

## 2017-09-12 NOTE — Telephone Encounter (Signed)
We received pt's cologuard results back, they were positive. Dr. Glori Bickers put a note on her results saying. "Positive cologuard, she needs a colonoscopy, let me know if pt agrees"  Called pt and no answer so left voicemail requesting pt to call the office back.  Results sent to scanning and a copy made to give to our [facility] if pt agrees with referral

## 2017-09-15 NOTE — Telephone Encounter (Signed)
Left 2nd voicemail requesting pt to call the office back 

## 2017-10-05 DIAGNOSIS — R195 Other fecal abnormalities: Secondary | ICD-10-CM | POA: Insufficient documentation

## 2017-10-05 NOTE — Telephone Encounter (Signed)
Called pt and advise her of her positive cologuard results. She is okay with a referral to GI for a colonoscopy. Please put referral in and I advise pt our [facility] will call to schedule appt

## 2017-10-05 NOTE — Telephone Encounter (Signed)
Ref done  Will route to [facility] 

## 2017-10-05 NOTE — Addendum Note (Signed)
Addended by: Loura Pardon A on: 10/05/2017 08:31 AM   Modules accepted: Orders

## 2017-10-06 NOTE — Telephone Encounter (Signed)
LMOM

## 2017-10-07 ENCOUNTER — Encounter: Payer: Self-pay | Admitting: Internal Medicine

## 2017-10-11 ENCOUNTER — Encounter: Payer: Self-pay | Admitting: Internal Medicine

## 2017-10-11 ENCOUNTER — Ambulatory Visit (AMBULATORY_SURGERY_CENTER): Payer: Self-pay | Admitting: *Deleted

## 2017-10-11 VITALS — Ht 64.0 in | Wt 137.4 lb

## 2017-10-11 DIAGNOSIS — R195 Other fecal abnormalities: Secondary | ICD-10-CM

## 2017-10-11 DIAGNOSIS — Z800 Family history of malignant neoplasm of digestive organs: Secondary | ICD-10-CM

## 2017-10-11 NOTE — Progress Notes (Signed)
No egg or soy allergy known to patient  Pt. Has had issues with past sedation with any surgeries  or procedures trouble waking up and passed out after getting up to restroom.  Had a lot of pain after procedure.  no intubation problems  No diet pills per patient No home 02 use per patient  No blood thinners per patient  Pt denies issues with constipation  No A fib or A flutter  EMMI video sent to pt's e mail pt. declined

## 2017-10-13 ENCOUNTER — Ambulatory Visit (INDEPENDENT_AMBULATORY_CARE_PROVIDER_SITE_OTHER): Payer: Medicare Other

## 2017-10-13 DIAGNOSIS — Z23 Encounter for immunization: Secondary | ICD-10-CM | POA: Diagnosis not present

## 2017-10-20 ENCOUNTER — Telehealth: Payer: Self-pay | Admitting: Internal Medicine

## 2017-10-20 NOTE — Telephone Encounter (Signed)
Returned patients call. Patient was asking if it was ok to drink tonic water the day before her procedure. I advised that as long as it was a clear liquid it would be ok. Patient verbalized understanding.   Riki Sheer, LPN

## 2017-10-21 ENCOUNTER — Encounter: Payer: Self-pay | Admitting: Internal Medicine

## 2017-10-21 ENCOUNTER — Other Ambulatory Visit: Payer: Self-pay

## 2017-10-21 ENCOUNTER — Ambulatory Visit (AMBULATORY_SURGERY_CENTER): Payer: Medicare Other | Admitting: Internal Medicine

## 2017-10-21 VITALS — BP 139/80 | HR 65 | Temp 97.3°F | Resp 18 | Ht 64.0 in | Wt 137.4 lb

## 2017-10-21 DIAGNOSIS — R195 Other fecal abnormalities: Secondary | ICD-10-CM | POA: Diagnosis not present

## 2017-10-21 DIAGNOSIS — D122 Benign neoplasm of ascending colon: Secondary | ICD-10-CM

## 2017-10-21 DIAGNOSIS — D120 Benign neoplasm of cecum: Secondary | ICD-10-CM

## 2017-10-21 MED ORDER — SODIUM CHLORIDE 0.9 % IV SOLN: 500.0000 mL | INTRAVENOUS | Status: DC

## 2017-10-21 NOTE — Progress Notes (Signed)
Called to room to assist during endoscopic procedure.  Patient ID and intended procedure confirmed with present staff. Received instructions for my participation in the procedure from the performing physician.  

## 2017-10-21 NOTE — Patient Instructions (Addendum)
   I found and removed 3 benign-appearing and small polyps.. I will let you know pathology results and when/if to have another routine colonoscopy by mail and/or My Chart.  I appreciate the opportunity to care for you. Gatha Mayer, MD, [facility]  ** Handout given on polyps **  YOU HAD AN ENDOSCOPIC PROCEDURE TODAY AT [facility]:   Refer to the procedure report that was given to you for any specific questions about what was found during the examination.  If the procedure report does not answer your questions, please call your gastroenterologist to clarify.  If you requested that your care partner not be given the details of your procedure findings, then the procedure report has been included in a sealed envelope for you to review at your convenience later.  YOU SHOULD EXPECT: Some feelings of bloating in the abdomen. Passage of more gas than usual.  Walking can help get rid of the air that was put into your GI tract during the procedure and reduce the bloating. If you had a lower endoscopy (such as a colonoscopy or flexible sigmoidoscopy) you may notice spotting of blood in your stool or on the toilet paper. If you underwent a bowel prep for your procedure, you may not have a normal bowel movement for a few days.  Please Note:  You might notice some irritation and congestion in your nose or some drainage.  This is from the oxygen used during your procedure.  There is no need for concern and it should clear up in a day or so.  SYMPTOMS TO REPORT IMMEDIATELY:   Following lower endoscopy (colonoscopy or flexible sigmoidoscopy):  Excessive amounts of blood in the stool  Significant tenderness or worsening of abdominal pains  Swelling of the abdomen that is new, acute  Fever of 100F or higher   For urgent or emergent issues, a gastroenterologist can be reached at any hour by calling [phone].   DIET:  We do recommend a small meal at first, but then you may proceed to  your regular diet.  Drink plenty of fluids but you should avoid alcoholic beverages for 24 hours.  ACTIVITY:  You should plan to take it easy for the rest of today and you should NOT DRIVE or use heavy machinery until tomorrow (because of the sedation medicines used during the test).    FOLLOW UP: Our staff will call the number listed on your records the next business day following your procedure to check on you and address any questions or concerns that you may have regarding the information given to you following your procedure. If we do not reach you, we will leave a message.  However, if you are feeling well and you are not experiencing any problems, there is no need to return our call.  We will assume that you have returned to your regular daily activities without incident.  If any biopsies were taken you will be contacted by phone or by letter within the next 1-3 weeks.  Please call us at [phone] if you have not heard about the biopsies in 3 weeks.    SIGNATURES/CONFIDENTIALITY: You and/or your care partner have signed paperwork which will be entered into your electronic medical record.  These signatures attest to the fact that that the information above on your After Visit Summary has been reviewed and is understood.  Full responsibility of the confidentiality of this discharge information lies with you and/or your care-partner.

## 2017-10-21 NOTE — Op Note (Signed)
[place] Patient Name: Kylie Acosta Procedure Date: 10/21/2017 9:57 AM MRN: [id number] Endoscopist: Gatha Mayer , MD Age: 71 Referring MD:  Date of Birth: 06-09-1946 Gender: Female Account #: [id number] Procedure:                Colonoscopy Indications:              Positive Cologuard test Medicines:                Propofol per Anesthesia, Monitored Anesthesia Care Procedure:                Pre-Anesthesia Assessment:                           - Prior to the procedure, a History and Physical                            was performed, and patient medications and                            allergies were reviewed. The patient's tolerance of                            previous anesthesia was also reviewed. The risks                            and benefits of the procedure and the sedation                            options and risks were discussed with the patient.                            All questions were answered, and informed consent                            was obtained. Prior Anticoagulants: The patient has                            taken no previous anticoagulant or antiplatelet                            agents. ASA Grade Assessment: II - A patient with                            mild systemic disease. After reviewing the risks                            and benefits, the patient was deemed in                            satisfactory condition to undergo the procedure.                           After obtaining informed consent, the colonoscope  was passed under direct vision. Throughout the                            procedure, the patient's blood pressure, pulse, and                            oxygen saturations were monitored continuously. The                            Model PCF-H190DL [id number]) scope was introduced                            through the anus and advanced to the the cecum,                            identified  by appendiceal orifice and ileocecal                            valve. The colonoscopy was performed without                            difficulty. The patient tolerated the procedure                            well. The quality of the bowel preparation was                            good. The bowel preparation used was Miralax. The                            ileocecal valve, appendiceal orifice, and rectum                            were photographed. Scope In: 10:03:35 AM Scope Out: 10:25:14 AM Scope Withdrawal Time: 0 hours 16 minutes 34 seconds  Total Procedure Duration: 0 hours 21 minutes 39 seconds  Findings:                 The perianal and digital rectal examinations were                            normal.                           Three sessile polyps were found in the ascending                            colon and cecum. The polyps were diminutive in                            size. These polyps were removed with a cold biopsy                            forceps. Resection and retrieval were complete.  Verification of patient identification for the                            specimen was done. Estimated blood loss was minimal.                           The exam was otherwise without abnormality on                            direct and retroflexion views. Complications:            No immediate complications. Estimated Blood Loss:     Estimated blood loss was minimal. Impression:               - Three diminutive polyps in the ascending colon                            and in the cecum, removed with a cold biopsy                            forceps. Resected and retrieved.                           - The examination was otherwise normal on direct                            and retroflexion views. Recommendation:           - Patient has a contact number available for                            emergencies. The signs and symptoms of potential                             delayed complications were discussed with the                            patient. Return to normal activities tomorrow.                            Written discharge instructions were provided to the                            patient.                           - Resume previous diet.                           - Continue present medications.                           - Repeat colonoscopy is recommended. The                            colonoscopy date will be determined after pathology  results from today's exam become available for                            review. Gatha Mayer, MD 10/21/2017 10:32:54 AM This report has been signed electronically.

## 2017-10-21 NOTE — Progress Notes (Signed)
Pt's states no medical or surgical changes since previsit or office visit. 

## 2017-10-21 NOTE — Progress Notes (Signed)
  [facility] Post-op Note  Patient: Kylie Acosta  Procedure(s) Performed: colonoscopy  Patient Location: [facility] - Recovery Area  Anesthesia Type: Deep Sedation/Propofol  Level of Consciousness: awake, oriented and patient cooperative  Airway and Oxygen Therapy: Patient Spontanous Breathing  Post-op Pain: none  Post-op Assessment:  Post-op Vital signs reviewed, Patient's Cardiovascular Status Stable, Respiratory Function Stable, Patent Airway, No signs of Nausea or vomiting and Pain level controlled  Post-op Vital Signs: Reviewed and stable  Complications: No apparent anesthesia complications  Einar Nolasco E Melessia Kaus 10:32 AM

## 2017-10-24 ENCOUNTER — Telehealth: Payer: Self-pay

## 2017-10-24 NOTE — Telephone Encounter (Signed)
  Follow up Call-  Call back number 10/21/2017  Post procedure Call Back phone  # [phone]  Permission to leave phone message Yes  Some recent data might be hidden     Patient questions:  Do you have a fever, pain , or abdominal swelling? No. Pain Score  0 *  Have you tolerated food without any problems? Yes.    Have you been able to return to your normal activities? Yes.    Do you have any questions about your discharge instructions: Diet   No. Medications  No. Follow up visit  No.  Do you have questions or concerns about your Care? No.  Actions: * If pain score is 4 or above: No action needed, pain <4.  Pt was very pleased with her professional care she received.  No problems noted. maw

## 2017-10-26 ENCOUNTER — Encounter: Payer: Self-pay | Admitting: Internal Medicine

## 2017-10-26 DIAGNOSIS — Z86010 Personal history of colonic polyps: Secondary | ICD-10-CM | POA: Insufficient documentation

## 2017-10-26 NOTE — Progress Notes (Signed)
3 diminutive adenomas recall 2023

## 2018-06-13 ENCOUNTER — Ambulatory Visit: Payer: Medicare Other | Admitting: Podiatry

## 2018-06-13 ENCOUNTER — Other Ambulatory Visit: Payer: Self-pay | Admitting: Podiatry

## 2018-06-13 ENCOUNTER — Encounter: Payer: Self-pay | Admitting: Podiatry

## 2018-06-13 ENCOUNTER — Ambulatory Visit (INDEPENDENT_AMBULATORY_CARE_PROVIDER_SITE_OTHER): Payer: Medicare Other

## 2018-06-13 VITALS — BP 132/71 | HR 69

## 2018-06-13 DIAGNOSIS — S92511A Displaced fracture of proximal phalanx of right lesser toe(s), initial encounter for closed fracture: Secondary | ICD-10-CM

## 2018-06-13 DIAGNOSIS — M79671 Pain in right foot: Secondary | ICD-10-CM

## 2018-06-13 NOTE — Progress Notes (Signed)
   HPI: 72 year old female presenting today with a chief complaint of pain to the right fourth toe that began 3 weeks ago secondary to an injury. She states she hit the foot on the side of a chair and has had pain in the toe since. She reports associated swelling. Touching the toe and walking increases the pain. She has not done anything for treatment. Patient is here for further evaluation and treatment.   Past Medical History:  Diagnosis Date  . Allergy    allerlgic rhinitis  . Hyperlipidemia   . Osteopenia   . Osteoporosis      Physical Exam: General: The patient is alert and oriented x3 in no acute distress.  Dermatology: Skin is warm, dry and supple bilateral lower extremities. Negative for open lesions or macerations.  Vascular: Ecchymosis and edema noted to the right fourth toe. Palpable pedal pulses bilaterally. Capillary refill within normal limits.  Neurological: Epicritic and protective threshold grossly intact bilaterally.   Musculoskeletal Exam: Pain with palpation to the right fourth toe. Range of motion within normal limits to all pedal and ankle joints bilateral. Muscle strength 5/5 in all groups bilateral.   Radiographic Exam:  Closed, nondisplaced oblique fracture of the diaphysis of the proximal phalanx of the right fourth toe.     Assessment: 1. Fracture right fourth proximal phalanx, nondisplaced, closed   Plan of Care:  1. Patient evaluated. X-Rays reviewed.  2. Recommended OTC Aleve as needed.  3. Recommended stiff sole shoes. Patient declined a post op shoe.  4. Explained that fractures can take months to resolve.  5. Return to clinic as needed.       Edrick Kins, DPM [facility]  Dr. Edrick Kins, DPM    [address], [place] [address]                Office [phone]  Fax [phone]

## 2018-07-24 ENCOUNTER — Ambulatory Visit: Payer: Medicare Other | Admitting: Family Medicine

## 2018-07-24 ENCOUNTER — Encounter: Payer: Self-pay | Admitting: Family Medicine

## 2018-07-24 VITALS — BP 112/68 | HR 73 | Temp 98.2°F | Ht 63.75 in | Wt 136.0 lb

## 2018-07-24 DIAGNOSIS — E559 Vitamin D deficiency, unspecified: Secondary | ICD-10-CM

## 2018-07-24 DIAGNOSIS — E2839 Other primary ovarian failure: Secondary | ICD-10-CM

## 2018-07-24 DIAGNOSIS — M810 Age-related osteoporosis without current pathological fracture: Secondary | ICD-10-CM | POA: Diagnosis not present

## 2018-07-24 DIAGNOSIS — L309 Dermatitis, unspecified: Secondary | ICD-10-CM

## 2018-07-24 MED ORDER — DESOXIMETASONE 0.25 % EX CREA: 1.0000 "application " | TOPICAL_CREAM | Freq: Two times a day (BID) | CUTANEOUS | 3 refills | Status: DC

## 2018-07-24 NOTE — Patient Instructions (Signed)
Check in with your insurance company regarding Prolia  We will refer you for a bone density test   Labs as planned in September   Try not to fall  Keep taking your calcium and vitamin D  I refilled your cream

## 2018-07-24 NOTE — Assessment & Plan Note (Signed)
Intolerant of oral alendronate and also raloxifene (reviewed)  fx toe recently  No falls On ca and D Will have labs (planned) early sept -to include Ca level/ renal labs and D level  She is caucasian with fam hx of OP and personal hx of small frame  Would like to consider Prolia  Ref for 2 y f/u dexa  Once labs return can begin process of insurance approval for prolia  Disc imp of fall prev  Keep walking for exercise

## 2018-07-24 NOTE — Assessment & Plan Note (Signed)
Pt has eczema on both hands -and uses desoximetasone 0.25% prn  Pt does not tolerate most moisturizers- uses vaseline and protects hands Mild on exam today  Medication refilled

## 2018-07-24 NOTE — Assessment & Plan Note (Signed)
2 y dexa ordered

## 2018-07-24 NOTE — Progress Notes (Signed)
Subjective:    Patient ID: Kylie Acosta, female    DOB: 03-19-1946, 72 y.o.   MRN: [id number]  HPI Here to discuss medications (needs refill of topicort)  Interested in starting Prolia   Doing well   Eczema on hands- uses the steroid cream when needed Allergic to almost all moisturizers Can use vaseline however  Unsure about oils  Protects hands with gloves Also avoids detergents/ hot water (that worsen it)  Not seasonal  Some itching    OP  Due for 2 y dexa Intol of alendronate and evista in the past  Alendronate caused reflux symptoms  Raloxifene= bad hot flashes  Takes calcium and D Last D level a year ago was in the 67s   She hit her toe on chair on Saturday R 4th toe  Will take a while to heal all the way  No post op shoe or boot  fx was displaced per podiatry note rev from 7/2  (Dr Amalia Hailey)  No falls  No other fractures    dexa 7/17 showed worsening OP  Her LS T score was -2.8 (down from 2.0)   Thinks her mother and grandmother had OP  Neither had kyphosis- but mother died at 77  Exercise- regular walking 1-2 times daily       Wt Readings from Last 3 Encounters:  07/24/18 136 lb (61.7 kg)  10/21/17 137 lb 6.4 oz (62.3 kg)  10/11/17 137 lb 6.4 oz (62.3 kg)   23.53 kg/m     Lab Results  Component Value Date   CREATININE 0.77 08/09/2017   BUN 11 08/09/2017   NA 136 08/09/2017   K 4.1 08/09/2017   CL 99 08/09/2017   CO2 31 08/09/2017    Patient Active Problem List   Diagnosis Date Noted  . Estrogen deficiency 07/24/2018  . Hx of adenomatous colonic polyps 10/26/2017  . Routine general medical examination at a health care facility 07/30/2015  . Vitamin D deficiency 07/30/2015  . Encounter for Medicare annual wellness exam 04/06/2013  . Colon cancer screening 04/06/2013  . GERD 07/07/2010  . VAGINITIS, ATROPHIC 10/15/2009  . Osteoporosis 07/19/2007  . Hyperlipidemia 07/14/2007  . ALLERGIC RHINITIS 07/14/2007  . Eczema of hand  07/14/2007   Past Medical History:  Diagnosis Date  . Allergy    allerlgic rhinitis  . Hyperlipidemia   . Osteopenia   . Osteoporosis    Past Surgical History:  Procedure Laterality Date  . ABDOMINAL HYSTERECTOMY  1997   fibroids  . COLONOSCOPY     [place]/2005   Social History   Tobacco Use  . Smoking status: Never Smoker  . Smokeless tobacco: Never Used  Substance Use Topics  . Alcohol use: No    Alcohol/week: 0.0 standard drinks  . Drug use: No   Family History  Problem Relation Age of Onset  . Colon cancer Paternal Aunt   . Colon polyps Neg Hx   . Esophageal cancer Neg Hx   . Rectal cancer Neg Hx   . Stomach cancer Neg Hx    Allergies  Allergen Reactions  . Benadryl [Diphenhydramine] Itching    Severe itching. Allergic reaction to cream.  . Alendronate Sodium     REACTION: gerd  . Atorvastatin     REACTION: muscle and joint pain  . Famotidine     REACTION: not effective  . Omeprazole     REACTION: not effective  . Raloxifene     REACTION: hot flashes  . Sulfonamide Derivatives  REACTION: whelps   Current Outpatient Medications on File Prior to Visit  Medication Sig Dispense Refill  . Calcium Carbonate-Vit D-Min (CALCIUM 1200 PO) Take 1 capsule by mouth daily.    . cholecalciferol (VITAMIN D) 1000 UNITS tablet Take 2,000 Units by mouth daily.     . Multiple Vitamin (MULTIVITAMIN) capsule Take 1 capsule by mouth daily.    . simvastatin (ZOCOR) 20 MG tablet Take 1 tablet (20 mg total) by mouth daily. 90 tablet 3   Current Facility-Administered Medications on File Prior to Visit  Medication Dose Route Frequency Provider Last Rate Last Dose  . betamethasone acetate-betamethasone sodium phosphate (CELESTONE) injection 3 mg  3 mg Intramuscular Once Daylene Katayama M, DPM      . betamethasone acetate-betamethasone sodium phosphate (CELESTONE) injection 3 mg  3 mg Intramuscular Once Edrick Kins, DPM         Review of Systems  Constitutional: Negative for  activity change, appetite change, fatigue, fever and unexpected weight change.  HENT: Negative for congestion, ear pain, rhinorrhea, sinus pressure and sore throat.   Eyes: Negative for pain, redness and visual disturbance.  Respiratory: Negative for cough, shortness of breath and wheezing.   Cardiovascular: Negative for chest pain and palpitations.  Gastrointestinal: Negative for abdominal pain, blood in stool, constipation and diarrhea.  Endocrine: Negative for polydipsia and polyuria.  Genitourinary: Negative for dysuria, frequency and urgency.  Musculoskeletal: Negative for arthralgias, back pain, gait problem and myalgias.       Recent fractured toe (R 4th)     Skin: Positive for rash. Negative for pallor.       Eczema on hands   Allergic/Immunologic: Negative for environmental allergies.  Neurological: Negative for dizziness, syncope and headaches.  Hematological: Negative for adenopathy. Does not bruise/bleed easily.  Psychiatric/Behavioral: Negative for decreased concentration and dysphoric mood. The patient is not nervous/anxious.        Objective:   Physical Exam  Constitutional: She is oriented to person, place, and time. She appears well-developed and well-nourished. No distress.  Well appearing  Petite/small frame   HENT:  Head: Normocephalic and atraumatic.  Eyes: Pupils are equal, round, and reactive to light. Conjunctivae and EOM are normal.  Neck: Normal range of motion. Neck supple.  Cardiovascular: Normal rate, regular rhythm and normal heart sounds.  Pulmonary/Chest: Effort normal and breath sounds normal. She has no wheezes.  Musculoskeletal: Normal range of motion. She exhibits tenderness. She exhibits no edema or deformity.  No kyphosis  Sensitivity/tenderness of R 4th toe -no swelling  Is able to bear wt normally  Lymphadenopathy:    She has no cervical adenopathy.  Neurological: She is alert and oriented to person, place, and time. Coordination normal.    Skin: Skin is warm and dry. Rash noted. No erythema. No pallor.  Patches of dry/erythematous skin on hands consistent with mild eczema  No cracking or skin interruption or infection   Psychiatric: She has a normal mood and affect.          Assessment & Plan:   Problem List Items Addressed This Visit      Musculoskeletal and Integument   Eczema of hand    Pt has eczema on both hands -and uses desoximetasone 0.25% prn  Pt does not tolerate most moisturizers- uses vaseline and protects hands Mild on exam today  Medication refilled       Osteoporosis - Primary    Intolerant of oral alendronate and also raloxifene (reviewed)  fx toe recently  No falls On ca and D Will have labs (planned) early sept -to include Ca level/ renal labs and D level  She is caucasian with fam hx of OP and personal hx of small frame  Would like to consider Prolia  Ref for 2 y f/u dexa  Once labs return can begin process of insurance approval for prolia  Disc imp of fall prev  Keep walking for exercise         Other   Estrogen deficiency    2 y dexa ordered       Relevant Orders   DG Bone Density   Vitamin D deficiency    In setting of OP  Will check level as planned in sept  Enc to continue ca and D for bone health

## 2018-07-24 NOTE — Assessment & Plan Note (Signed)
In setting of OP  Will check level as planned in sept  Enc to continue ca and D for bone health

## 2018-08-10 ENCOUNTER — Encounter: Payer: Self-pay | Admitting: Family Medicine

## 2018-08-11 ENCOUNTER — Encounter: Payer: Self-pay | Admitting: Family Medicine

## 2018-08-14 ENCOUNTER — Other Ambulatory Visit: Payer: Self-pay | Admitting: Family Medicine

## 2018-08-17 ENCOUNTER — Telehealth: Payer: Self-pay | Admitting: Family Medicine

## 2018-08-17 DIAGNOSIS — E559 Vitamin D deficiency, unspecified: Secondary | ICD-10-CM

## 2018-08-17 DIAGNOSIS — E7800 Pure hypercholesterolemia, unspecified: Secondary | ICD-10-CM

## 2018-08-17 DIAGNOSIS — Z0000 Encounter for general adult medical examination without abnormal findings: Secondary | ICD-10-CM

## 2018-08-17 DIAGNOSIS — M810 Age-related osteoporosis without current pathological fracture: Secondary | ICD-10-CM

## 2018-08-17 NOTE — Telephone Encounter (Signed)
-----   Message from Eustace Pen, LPN sent at 01/21/6380  2:24 PM EDT ----- Regarding: Labs 9/6 Lab orders needed. Thank you.  Insurance:  [facility]

## 2018-08-18 ENCOUNTER — Ambulatory Visit (INDEPENDENT_AMBULATORY_CARE_PROVIDER_SITE_OTHER): Payer: Medicare Other

## 2018-08-18 VITALS — BP 110/70 | HR 77 | Temp 97.9°F | Ht 63.5 in | Wt 135.0 lb

## 2018-08-18 DIAGNOSIS — E7800 Pure hypercholesterolemia, unspecified: Secondary | ICD-10-CM | POA: Diagnosis not present

## 2018-08-18 DIAGNOSIS — E559 Vitamin D deficiency, unspecified: Secondary | ICD-10-CM

## 2018-08-18 DIAGNOSIS — Z0000 Encounter for general adult medical examination without abnormal findings: Secondary | ICD-10-CM

## 2018-08-18 LAB — COMPREHENSIVE METABOLIC PANEL
ALT: 18 U/L (ref 0–35)
AST: 23 U/L (ref 0–37)
Albumin: 4.4 g/dL (ref 3.5–5.2)
Alkaline Phosphatase: 51 U/L (ref 39–117)
BILIRUBIN TOTAL: 0.6 mg/dL (ref 0.2–1.2)
BUN: 13 mg/dL (ref 6–23)
CALCIUM: 9.7 mg/dL (ref 8.4–10.5)
CO2: 32 meq/L (ref 19–32)
CREATININE: 0.77 mg/dL (ref 0.40–1.20)
Chloride: 105 mEq/L (ref 96–112)
GFR: 77.79 mL/min (ref >60.00)
Glucose, Bld: 66 mg/dL — ABNORMAL LOW (ref 70–99)
Potassium: 4 mEq/L (ref 3.5–5.1)
Sodium: 142 mEq/L (ref 135–145)
Total Protein: 6.9 g/dL (ref 6.0–8.3)

## 2018-08-18 LAB — LIPID PANEL
CHOL/HDL RATIO: 2
Cholesterol: 168 mg/dL (ref 0–200)
HDL: 73.6 mg/dL (ref >39.00)
LDL Cholesterol: 78 mg/dL (ref 0–99)
NonHDL: 94.12
TRIGLYCERIDES: 79 mg/dL (ref 0.0–149.0)
VLDL: 15.8 mg/dL (ref 0.0–40.0)

## 2018-08-18 LAB — VITAMIN D 25 HYDROXY (VIT D DEFICIENCY, FRACTURES): VITD: 46.43 ng/mL (ref 30.00–100.00)

## 2018-08-18 LAB — CBC WITH DIFFERENTIAL/PLATELET
BASOS ABS: 0 10*3/uL (ref 0.0–0.1)
Basophils Relative: 0.8 % (ref 0.0–3.0)
Eosinophils Absolute: 0.2 10*3/uL (ref 0.0–0.7)
Eosinophils Relative: 3 % (ref 0.0–5.0)
HEMATOCRIT: 40.6 % (ref 36.0–46.0)
Hemoglobin: 13.8 g/dL (ref 12.0–15.0)
LYMPHS PCT: 27.6 % (ref 12.0–46.0)
Lymphs Abs: 1.5 10*3/uL (ref 0.7–4.0)
MCHC: 33.9 g/dL (ref 30.0–36.0)
MCV: 94.9 fl (ref 78.0–100.0)
Monocytes Absolute: 0.5 10*3/uL (ref 0.1–1.0)
Monocytes Relative: 9 % (ref 3.0–12.0)
NEUTROS ABS: 3.1 10*3/uL (ref 1.4–7.7)
Neutrophils Relative %: 59.6 % (ref 43.0–77.0)
PLATELETS: 266 10*3/uL (ref 150.0–400.0)
RBC: 4.28 Mil/uL (ref 3.87–5.11)
RDW: 12.6 % (ref 11.5–15.5)
WBC: 5.3 10*3/uL (ref 4.0–10.5)

## 2018-08-18 LAB — TSH: TSH: 3.3 u[IU]/mL (ref 0.35–4.50)

## 2018-08-18 NOTE — Patient Instructions (Addendum)
Ms. Frentz , Thank you for taking time to come for your Medicare Wellness Visit. I appreciate your ongoing commitment to your health goals. Please review the following plan we discussed and let me know if I can assist you in the future.   These are the goals we discussed: Goals    . Increase physical activity     Starting 08/18/2018, I will continue to walk at least 30 minutes daily.        This is a list of the screening recommended for you and due dates:  Health Maintenance  Topic Date Due  . Flu Shot  03/14/2019*  . Stool Blood Test  10/21/2018  . Mammogram  08/09/2020  . Colon Cancer Screening  10/21/2022  . Tetanus Vaccine  04/07/2023  . DEXA scan (bone density measurement)  Completed  .  Hepatitis C: One time screening is recommended by Center for Disease Control  (CDC) for  adults born from [birth year] through 1967.   Completed  . Pneumonia vaccines  Completed  *Topic was postponed. The date shown is not the original due date.   Preventive Care for Adults  A healthy lifestyle and preventive care can promote health and wellness. Preventive health guidelines for adults include the following key practices.  . A routine yearly physical is a good way to check with your health care provider about your health and preventive screening. It is a chance to share any concerns and updates on your health and to receive a thorough exam.  . Visit your dentist for a routine exam and preventive care every 6 months. Brush your teeth twice a day and floss once a day. Good oral hygiene prevents tooth decay and gum disease.  . The frequency of eye exams is based on your age, health, family medical history, use  of contact lenses, and other factors. Follow your health care provider's recommendations for frequency of eye exams.  . Eat a healthy diet. Foods like vegetables, fruits, whole grains, low-fat dairy products, and lean protein foods contain the nutrients you need without too many calories. Decrease  your intake of foods high in solid fats, added sugars, and salt. Eat the right amount of calories for you. Get information about a proper diet from your health care provider, if necessary.  . Regular physical exercise is one of the most important things you can do for your health. Most adults should get at least 150 minutes of moderate-intensity exercise (any activity that increases your heart rate and causes you to sweat) each week. In addition, most adults need muscle-strengthening exercises on 2 or more days a week.  Silver Sneakers may be a benefit available to you. To determine eligibility, you may visit the website: www.silversneakers.com or contact program at 951-073-4562 Mon-Fri between 8AM-8PM.   . Maintain a healthy weight. The body mass index (BMI) is a screening tool to identify possible weight problems. It provides an estimate of body fat based on height and weight. Your health care provider can find your BMI and can help you achieve or maintain a healthy weight.   For adults 20 years and older: ? A BMI below 18.5 is considered underweight. ? A BMI of 18.5 to 24.9 is normal. ? A BMI of 25 to 29.9 is considered overweight. ? A BMI of 30 and above is considered obese.   . Maintain normal blood lipids and cholesterol levels by exercising and minimizing your intake of saturated fat. Eat a balanced diet with plenty of fruit and  vegetables. Blood tests for lipids and cholesterol should begin at age 13 and be repeated every 5 years. If your lipid or cholesterol levels are high, you are over 50, or you are at high risk for heart disease, you may need your cholesterol levels checked more frequently. Ongoing high lipid and cholesterol levels should be treated with medicines if diet and exercise are not working.  . If you smoke, find out from your health care provider how to quit. If you do not use tobacco, please do not start.  . If you choose to drink alcohol, please do not consume more than  2 drinks per day. One drink is considered to be 12 ounces (355 mL) of beer, 5 ounces (148 mL) of wine, or 1.5 ounces (44 mL) of liquor.  . If you are 54-1 years old, ask your health care provider if you should take aspirin to prevent strokes.  . Use sunscreen. Apply sunscreen liberally and repeatedly throughout the day. You should seek shade when your shadow is shorter than you. Protect yourself by wearing long sleeves, pants, a wide-brimmed hat, and sunglasses year round, whenever you are outdoors.  . Once a month, do a whole body skin exam, using a mirror to look at the skin on your back. Tell your health care provider of new moles, moles that have irregular borders, moles that are larger than a pencil eraser, or moles that have changed in shape or color.

## 2018-08-18 NOTE — Progress Notes (Signed)
PCP notes:   Health maintenance:  Flu vaccine - addressed  Abnormal screenings:   Hearing - failed  Hearing Screening   125Hz  250Hz  500Hz  1000Hz  2000Hz  3000Hz  4000Hz  6000Hz  8000Hz   Right ear:   40 40 40  40    Left ear:   40 40 40  0     Patient concerns:   None  Nurse concerns:  None  Next PCP appt:   08/22/18 @ 0930  I reviewed health advisor's note, was available for consultation, and agree with documentation and plan. Loura Pardon MD

## 2018-08-18 NOTE — Progress Notes (Signed)
Subjective:   Kylie Acosta is a 72 y.o. female who presents for Medicare Annual (Subsequent) preventive examination.  Review of Systems:  N/A Cardiac Risk Factors include: advanced age (>65men, >13 women);dyslipidemia     Objective:     Vitals: BP 110/70 (BP Location: Right Arm, Patient Position: Sitting, Cuff Size: Normal)   Pulse 77   Temp 97.9 F (36.6 C) (Oral)   Ht 5' 3.5" (1.613 m) Comment: no shoes  Wt 135 lb (61.2 kg)   SpO2 97%   BMI 23.54 kg/m   Body mass index is 23.54 kg/m.  Advanced Directives 08/18/2018 10/21/2017 10/11/2017 08/09/2017 07/08/2016  Does Patient Have a Medical Advance Directive? Yes Yes Yes Yes Yes  Type of Paramedic of [place];Living will [place];Living will [place];Living will [place];Living will [place];Living will  Does patient want to make changes to medical advance directive? - - - No - Patient declined No - Patient declined  Copy of [place] in Chart? Yes - - No - copy requested No - copy requested  Would patient like information on creating a medical advance directive? No - Patient declined - - - -    Tobacco Social History   Tobacco Use  Smoking Status Never Smoker  Smokeless Tobacco Never Used     Counseling given: No   Clinical Intake:  Pre-visit preparation completed: Yes  Pain : No/denies pain Pain Score: 0-No pain     Nutritional Status: BMI 25 -29 Overweight Nutritional Risks: None Diabetes: No  How often do you need to have someone help you when you read instructions, pamphlets, or other written materials from your doctor or pharmacy?: 1 - Never What is the last grade level you completed in school?: Masters degree  Interpreter Needed?: No  Comments: pt lives with spouse Information entered by :: LPinson, LPN  Past Medical History:  Diagnosis Date  . Allergy    allerlgic rhinitis    . Hyperlipidemia   . Osteopenia   . Osteoporosis    Past Surgical History:  Procedure Laterality Date  . ABDOMINAL HYSTERECTOMY  1997   fibroids  . COLONOSCOPY     Medoff/2005   Family History  Problem Relation Age of Onset  . Colon cancer Paternal Aunt   . Colon polyps Neg Hx   . Esophageal cancer Neg Hx   . Rectal cancer Neg Hx   . Stomach cancer Neg Hx    Social History   Socioeconomic History  . Marital status: Married    Spouse name: Not on file  . Number of children: Not on file  . Years of education: Not on file  . Highest education level: Not on file  Occupational History  . Not on file  Social Needs  . Financial resource strain: Not on file  . Food insecurity:    Worry: Not on file    Inability: Not on file  . Transportation needs:    Medical: Not on file    Non-medical: Not on file  Tobacco Use  . Smoking status: Never Smoker  . Smokeless tobacco: Never Used  Substance and Sexual Activity  . Alcohol use: No    Alcohol/week: 0.0 standard drinks  . Drug use: No  . Sexual activity: Never  Lifestyle  . Physical activity:    Days per week: Not on file    Minutes per session: Not on file  . Stress: Not on  file  Relationships  . Social connections:    Talks on phone: Not on file    Gets together: Not on file    Attends religious service: Not on file    Active member of club or organization: Not on file    Attends meetings of clubs or organizations: Not on file    Relationship status: Not on file  Other Topics Concern  . Not on file  Social History Narrative  . Not on file    Outpatient Encounter Medications as of 08/18/2018  Medication Sig  . Calcium Carbonate-Vit D-Min (CALCIUM 1200 PO) Take 1 capsule by mouth daily.  . cholecalciferol (VITAMIN D) 1000 UNITS tablet Take 2,000 Units by mouth daily.   Marland Kitchen desoximetasone (TOPICORT) 0.25 % cream Apply 1 application topically 2 (two) times daily. To eczema on hands  . Multiple Vitamin (MULTIVITAMIN)  capsule Take 1 capsule by mouth daily.  . simvastatin (ZOCOR) 20 MG tablet TAKE 1 TABLET(20 MG) BY MOUTH DAILY   Facility-Administered Encounter Medications as of 08/18/2018  Medication  . betamethasone acetate-betamethasone sodium phosphate (CELESTONE) injection 3 mg  . betamethasone acetate-betamethasone sodium phosphate (CELESTONE) injection 3 mg    Activities of Daily Living In your present state of health, do you have any difficulty performing the following activities: 08/18/2018  Hearing? N  Vision? N  Difficulty concentrating or making decisions? N  Walking or climbing stairs? N  Dressing or bathing? N  Doing errands, shopping? N  Preparing Food and eating ? N  Using the Toilet? N  In the past six months, have you accidently leaked urine? N  Do you have problems with loss of bowel control? N  Managing your Medications? N  Managing your Finances? N  Housekeeping or managing your Housekeeping? N  Some recent data might be hidden    Patient Care Team: Tower, Wynelle Fanny, MD as PCP - General Ralene Bathe, MD as Referring Physician (Dermatology) Garfield Cornea, OD as Referring Physician (Optometry)    Assessment:   This is a routine wellness examination for Kylie Acosta.   Hearing Screening   125Hz  250Hz  500Hz  1000Hz  2000Hz  3000Hz  4000Hz  6000Hz  8000Hz   Right ear:   40 40 40  40    Left ear:   40 40 40  0      Visual Acuity Screening   Right eye Left eye Both eyes  Without correction:     With correction: 20/30 20/30 20/30     Exercise Activities and Dietary recommendations Current Exercise Habits: Home exercise routine, Type of exercise: walking, Time (Minutes): 30, Frequency (Times/Week): 7, Weekly Exercise (Minutes/Week): 210, Intensity: Mild, Exercise limited by: None identified  Goals    . Increase physical activity     Starting 08/18/2018, I will continue to walk at least 30 minutes daily.        Fall Risk Fall Risk  08/18/2018 08/09/2017 07/08/2016 08/04/2015 05/01/2014    Falls in the past year? No No Yes No No  Number falls in past yr: - - 1 - -  Injury with Fall? - - No - -  Follow up - - Falls evaluation completed - -   Depression Screen PHQ 2/9 Scores 08/18/2018 08/09/2017 07/08/2016 08/04/2015  PHQ - 2 Score 0 0 0 0  PHQ- 9 Score 0 0 - -     Cognitive Function MMSE - Mini Mental State Exam 08/18/2018 08/09/2017 07/08/2016  Orientation to time 5 5 5   Orientation to Place 5 5 5   Registration 3 3  3  Attention/ Calculation 0 0 0  Recall 3 3 3   Language- name 2 objects 0 0 0  Language- repeat 1 1 1   Language- follow 3 step command 3 3 3   Language- read & follow direction 0 0 0  Write a sentence 0 0 0  Copy design 0 0 0  Total score 20 20 20      PLEASE NOTE: A Mini-Cog screen was completed. Maximum score is 20. A value of 0 denotes this part of [place] MMSE was not completed or the patient failed this part of the Mini-Cog screening.   Mini-Cog Screening Orientation to Time - Max 5 pts Orientation to Place - Max 5 pts Registration - Max 3 pts Recall - Max 3 pts Language Repeat - Max 1 pts Language Follow 3 Step Command - Max 3 pts     Immunization History  Administered Date(s) Administered  . Influenza Split 11/30/2011, 11/29/2012  . Influenza Whole 09/12/2002, 10/11/2008, 10/07/2009, 10/22/2010  . Influenza,inj,Quad PF,6+ Mos 11/07/2013, 10/22/2015, 10/08/2016, 10/13/2017  . Pneumococcal Conjugate-13 05/01/2014  . Pneumococcal Polysaccharide-23 01/11/2012  . Td 10/14/2003, 04/06/2013  . Zoster 07/24/2007    Screening Tests Health Maintenance  Topic Date Due  . INFLUENZA VACCINE  03/14/2019 (Originally 07/13/2018)  . COLON CANCER SCREENING ANNUAL FOBT  10/21/2018  . MAMMOGRAM  08/09/2020  . COLONOSCOPY  10/21/2022  . TETANUS/TDAP  04/07/2023  . DEXA SCAN  Completed  . Hepatitis C Screening  Completed  . PNA vac Low Risk Adult  Completed     Plan:     I have personally reviewed, addressed, and noted the following in the patient's  chart:  A. Medical and social history B. Use of alcohol, tobacco or illicit drugs  C. Current medications and supplements D. Functional ability and status E.  Nutritional status F.  Physical activity G. Advance directives H. List of other physicians I.  Hospitalizations, surgeries, and ER visits in previous 12 months J.  [place] to include hearing, vision, cognitive, depression L. Referrals and appointments - none  In addition, I have reviewed and discussed with patient certain preventive protocols, quality metrics, and best practice recommendations. A written personalized care plan for preventive services as well as general preventive health recommendations were provided to patient.  See attached scanned questionnaire for additional information.   Signed,   Lindell Noe, MHA, BS, LPN Health Coach

## 2018-08-22 ENCOUNTER — Encounter: Payer: Self-pay | Admitting: Family Medicine

## 2018-08-22 ENCOUNTER — Telehealth: Payer: Self-pay | Admitting: *Deleted

## 2018-08-22 ENCOUNTER — Ambulatory Visit (INDEPENDENT_AMBULATORY_CARE_PROVIDER_SITE_OTHER): Payer: Medicare Other | Admitting: Family Medicine

## 2018-08-22 VITALS — BP 118/62 | HR 75 | Temp 97.8°F | Ht 63.5 in | Wt 137.5 lb

## 2018-08-22 DIAGNOSIS — E7800 Pure hypercholesterolemia, unspecified: Secondary | ICD-10-CM | POA: Diagnosis not present

## 2018-08-22 DIAGNOSIS — E559 Vitamin D deficiency, unspecified: Secondary | ICD-10-CM | POA: Diagnosis not present

## 2018-08-22 DIAGNOSIS — Z23 Encounter for immunization: Secondary | ICD-10-CM | POA: Diagnosis not present

## 2018-08-22 DIAGNOSIS — M810 Age-related osteoporosis without current pathological fracture: Secondary | ICD-10-CM

## 2018-08-22 DIAGNOSIS — Z0000 Encounter for general adult medical examination without abnormal findings: Secondary | ICD-10-CM | POA: Diagnosis not present

## 2018-08-22 MED ORDER — SIMVASTATIN 20 MG PO TABS: ORAL_TABLET | ORAL | 3 refills | Status: DC

## 2018-08-22 NOTE — Assessment & Plan Note (Signed)
Vitamin D level is therapeutic with current supplementation Disc importance of this to bone and overall health Level of 46 Continue current supplementation

## 2018-08-22 NOTE — Patient Instructions (Addendum)
Now that labs are back-we will start the process of getting Prolia approved   Flu shot today   Eat regular meals with protein so blood sugar stays stable    Stay active mentally and physically

## 2018-08-22 NOTE — Telephone Encounter (Signed)
Great, thanks

## 2018-08-22 NOTE — Progress Notes (Signed)
Subjective:    Patient ID: Kylie Acosta, female    DOB: 11-Jun-1946, 72 y.o.   MRN: [id number]  HPI Here for health maintenance exam and to review chronic medical problems    Feeling good   Had amw on 9/6 Missed L ear high tone in hearing screen - no problems at home   No concerns   Wt Readings from Last 3 Encounters:  08/22/18 137 lb 8 oz (62.4 kg)  08/18/18 135 lb (61.2 kg)  07/24/18 136 lb (61.7 kg)  stable /able to mt weight  Stays very active  Eats well also  23.97 kg/m   Flu shot-wants today   ifob 11/18  Mammogram 8/19 nl  Self breast exam -no lumps or changes   Colonoscopy 11/18 with 5 year recall -3 polyps   dexa 8/19 -OP in spine/ osteopenia elsewhere  Interested in prolia Lab Results  Component Value Date   CALCIUM 9.7 08/18/2018   PHOS 3.5 09/01/2010  intolerant of alendronate and raloxifene  Vit D level is 46   zostavax 8/18   Hyperlipidemia Lab Results  Component Value Date   CHOL 168 08/18/2018   CHOL 171 08/09/2017   CHOL 151 07/08/2016   Lab Results  Component Value Date   HDL 73.60 08/18/2018   HDL 67.10 08/09/2017   HDL 60.90 07/08/2016   Lab Results  Component Value Date   LDLCALC 78 08/18/2018   LDLCALC 93 08/09/2017   LDLCALC 78 07/08/2016   Lab Results  Component Value Date   TRIG 79.0 08/18/2018   TRIG 57.0 08/09/2017   TRIG 59.0 07/08/2016   Lab Results  Component Value Date   CHOLHDL 2 08/18/2018   CHOLHDL 3 08/09/2017   CHOLHDL 2 07/08/2016   Lab Results  Component Value Date   LDLDIRECT 151.5 09/01/2010   On zocor and diet  Walking more- HDL is up  LDL is down as well  Very good diet and good profile   Other labs Lab Results  Component Value Date   CREATININE 0.77 08/18/2018   BUN 13 08/18/2018   NA 142 08/18/2018   K 4.0 08/18/2018   CL 105 08/18/2018   CO2 32 08/18/2018   Lab Results  Component Value Date   ALT 18 08/18/2018   AST 23 08/18/2018   ALKPHOS 51 08/18/2018   BILITOT 0.6  08/18/2018   Lab Results  Component Value Date   WBC 5.3 08/18/2018   HGB 13.8 08/18/2018   HCT 40.6 08/18/2018   MCV 94.9 08/18/2018   PLT 266.0 08/18/2018   Lab Results  Component Value Date   TSH 3.30 08/18/2018    Glucose was low at time of draw 66 ? If she had eaten   Patient Active Problem List   Diagnosis Date Noted  . Estrogen deficiency 07/24/2018  . Hx of adenomatous colonic polyps 10/26/2017  . Routine general medical examination at a health care facility 07/30/2015  . Vitamin D deficiency 07/30/2015  . Encounter for Medicare annual wellness exam 04/06/2013  . Colon cancer screening 04/06/2013  . GERD 07/07/2010  . VAGINITIS, ATROPHIC 10/15/2009  . Osteoporosis 07/19/2007  . Hyperlipidemia 07/14/2007  . ALLERGIC RHINITIS 07/14/2007  . Eczema of hand 07/14/2007   Past Medical History:  Diagnosis Date  . Allergy    allerlgic rhinitis  . Hyperlipidemia   . Osteopenia   . Osteoporosis    Past Surgical History:  Procedure Laterality Date  . ABDOMINAL HYSTERECTOMY  1997   fibroids  .  COLONOSCOPY     Medoff/2005   Social History   Tobacco Use  . Smoking status: Never Smoker  . Smokeless tobacco: Never Used  Substance Use Topics  . Alcohol use: No    Alcohol/week: 0.0 standard drinks  . Drug use: No   Family History  Problem Relation Age of Onset  . Colon cancer Paternal Aunt   . Colon polyps Neg Hx   . Esophageal cancer Neg Hx   . Rectal cancer Neg Hx   . Stomach cancer Neg Hx    Allergies  Allergen Reactions  . Benadryl [Diphenhydramine] Itching    Severe itching. Allergic reaction to cream.  . Alendronate Sodium     REACTION: gerd  . Atorvastatin     REACTION: muscle and joint pain  . Famotidine     REACTION: not effective  . Omeprazole     REACTION: not effective  . Raloxifene     REACTION: hot flashes  . Sulfonamide Derivatives     REACTION: whelps   Current Outpatient Medications on File Prior to Visit  Medication Sig  Dispense Refill  . Calcium Carbonate-Vit D-Min (CALCIUM 1200 PO) Take 1 capsule by mouth daily.    . cholecalciferol (VITAMIN D) 1000 UNITS tablet Take 2,000 Units by mouth daily.     Marland Kitchen desoximetasone (TOPICORT) 0.25 % cream Apply 1 application topically 2 (two) times daily. To eczema on hands 30 g 3  . Multiple Vitamin (MULTIVITAMIN) capsule Take 1 capsule by mouth daily.    . simvastatin (ZOCOR) 20 MG tablet TAKE 1 TABLET(20 MG) BY MOUTH DAILY 90 tablet 1   Current Facility-Administered Medications on File Prior to Visit  Medication Dose Route Frequency Provider Last Rate Last Dose  . betamethasone acetate-betamethasone sodium phosphate (CELESTONE) injection 3 mg  3 mg Intramuscular Once Daylene Katayama M, DPM      . betamethasone acetate-betamethasone sodium phosphate (CELESTONE) injection 3 mg  3 mg Intramuscular Once Edrick Kins, DPM        Review of Systems  Constitutional: Negative for activity change, appetite change, fatigue, fever and unexpected weight change.  HENT: Negative for congestion, ear pain, rhinorrhea, sinus pressure and sore throat.   Eyes: Negative for pain, redness and visual disturbance.  Respiratory: Negative for cough, shortness of breath and wheezing.   Cardiovascular: Negative for chest pain and palpitations.  Gastrointestinal: Negative for abdominal pain, blood in stool, constipation and diarrhea.  Endocrine: Negative for polydipsia and polyuria.  Genitourinary: Negative for dysuria, frequency and urgency.  Musculoskeletal: Negative for arthralgias, back pain and myalgias.  Skin: Negative for pallor and rash.  Allergic/Immunologic: Negative for environmental allergies.  Neurological: Negative for dizziness, syncope and headaches.  Hematological: Negative for adenopathy. Does not bruise/bleed easily.  Psychiatric/Behavioral: Negative for decreased concentration and dysphoric mood. The patient is not nervous/anxious.        Objective:   Physical Exam    Constitutional: She appears well-developed and well-nourished. No distress.  Well appearing   HENT:  Head: Normocephalic and atraumatic.  Right Ear: External ear normal.  Left Ear: External ear normal.  Mouth/Throat: Oropharynx is clear and moist.  Eyes: Pupils are equal, round, and reactive to light. Conjunctivae and EOM are normal. No scleral icterus.  Neck: Normal range of motion. Neck supple. No JVD present. Carotid bruit is not present. No thyromegaly present.  Cardiovascular: Normal rate, regular rhythm, normal heart sounds and intact distal pulses. Exam reveals no gallop.  Pulmonary/Chest: Effort normal and breath sounds normal. No  respiratory distress. She has no wheezes. She exhibits no tenderness. No breast tenderness, discharge or bleeding.  Good air exch  Abdominal: Soft. Bowel sounds are normal. She exhibits no distension, no abdominal bruit and no mass. There is no tenderness.  Genitourinary: No breast tenderness, discharge or bleeding.  Genitourinary Comments: Breast exam: No mass, nodules, thickening, tenderness, bulging, retraction, inflamation, nipple discharge or skin changes noted.  No axillary or clavicular LA.      Musculoskeletal: Normal range of motion. She exhibits no edema or tenderness.  Mild kyphosis   Lymphadenopathy:    She has no cervical adenopathy.  Neurological: She is alert. She has normal reflexes. She displays normal reflexes. No cranial nerve deficit. She exhibits normal muscle tone. Coordination normal.  Skin: Skin is warm and dry. No rash noted. No erythema. No pallor.  Fair complexion  Few lentigines   Psychiatric: She has a normal mood and affect. Cognition and memory are normal.  Pleasant           Assessment & Plan:   Problem List Items Addressed This Visit      Musculoskeletal and Integument   Osteoporosis    Lab Results  Component Value Date   CALCIUM 9.7 08/18/2018   PHOS 3.5 09/01/2010   Intol of alendronate and raloxifene   No falls/recent fx  LS in OP range Rev recent dexa Will proceed with process to get Prolia approved  Disc fall precautions         Other   Hyperlipidemia    Disc goals for lipids and reasons to control them Rev last labs with pt Rev low sat fat diet in detail Improved since inc her walking  Continue zocor and good diet       Relevant Medications   simvastatin (ZOCOR) 20 MG tablet   Routine general medical examination at a health care facility - Primary    Reviewed health habits including diet and exercise and skin cancer prevention Reviewed appropriate screening tests for age  Also reviewed health mt list, fam hx and immunization status , as well as social and family history   See HPI Labs rev  Hearing test reviewed Will look into Prolia for OP - seek approval Enc to continue great diet and exercise  F/u with dermatology as planned  Flu shot today  Continue regular meals with protein       Vitamin D deficiency    Vitamin D level is therapeutic with current supplementation Disc importance of this to bone and overall health Level of 46 Continue current supplementation        Other Visit Diagnoses    Need for influenza vaccination       Relevant Orders   Flu Vaccine QUAD 6+ mos PF IM (Fluarix Quad PF) (Completed)

## 2018-08-22 NOTE — Assessment & Plan Note (Signed)
Disc goals for lipids and reasons to control them Rev last labs with pt Rev low sat fat diet in detail Improved since inc her walking  Continue zocor and good diet

## 2018-08-22 NOTE — Assessment & Plan Note (Signed)
Lab Results  Component Value Date   CALCIUM 9.7 08/18/2018   PHOS 3.5 09/01/2010   Intol of alendronate and raloxifene  No falls/recent fx  LS in OP range Rev recent dexa Will proceed with process to get Prolia approved  Disc fall precautions

## 2018-08-22 NOTE — Assessment & Plan Note (Signed)
Reviewed health habits including diet and exercise and skin cancer prevention Reviewed appropriate screening tests for age  Also reviewed health mt list, fam hx and immunization status , as well as social and family history   See HPI Labs rev  Hearing test reviewed Will look into Prolia for OP - seek approval Enc to continue great diet and exercise  F/u with dermatology as planned  Flu shot today  Continue regular meals with protein

## 2018-08-22 NOTE — Telephone Encounter (Signed)
-----   Message from Abner Greenspan, MD sent at 08/22/2018 10:55 AM EDT ----- Regarding: Prolia  Would you please start the Prolia process for her?  Thanks

## 2018-08-22 NOTE — Telephone Encounter (Signed)
Information has been submitted to pts insurance for verification of benefits. Awaiting response for coverage  

## 2018-09-06 NOTE — Telephone Encounter (Signed)
Verification of benefits have been processed and an approval has been received for pts prolia injection. Pts estimated cost are appx $90. This is only an estimate and cannot be confirmed until benefits are paid. Please advise pt and schedule if needed. If scheduled, once the injection is received, pls contact me back with the date it was received so that I am able to update prolia folder. thanks

## 2018-09-06 NOTE — Telephone Encounter (Signed)
Lm on pts vm requesting a call back 

## 2018-09-07 NOTE — Telephone Encounter (Signed)
Lm on pt vm and advised if prolia is received after 10/6, she will also need additional bloodwork. Should pt return call, pls schedule lab appt within 30d of scheduled injection OR she may have prolia before she leaves going out of town

## 2018-09-07 NOTE — Telephone Encounter (Addendum)
Pt would like to proceed with the prolia.  Pt aware of the information below. Pt would like to wait until she gets back from on 10/16 from vacation to get the injection. She wants to let you know that is why she is waiting that long to get. Pt has been scheduled for 10/10/18 per pt request

## 2018-09-08 NOTE — Telephone Encounter (Signed)
Pt would like to leave her prolia appt on 10/28 but would like to do her blood work on 10/21, or the afternoon of 10/22, in the morning of 10/24 or anytime on 10/25. No orders were in. Please call patient to sch

## 2018-09-08 NOTE — Telephone Encounter (Signed)
Lab Results  Component Value Date   CALCIUM 9.7 08/18/2018   PHOS 3.5 09/01/2010    Kylie Acosta- is this close enough to shot date ? If so -please let pt know  Thanks

## 2018-09-08 NOTE — Telephone Encounter (Signed)
It is recommended that the pt have a Ca lab completed within 30days of her receiving the prolia injection, but it is at the discretion of the PCP. It would not be detrimental if she did not have it within the 30d period

## 2018-09-08 NOTE — Telephone Encounter (Signed)
Pt had CMP on 08/18/18. Does pt need CA level done?

## 2018-09-08 NOTE — Telephone Encounter (Signed)
I am fine with not doing it  Thanks  Please let her know

## 2018-09-08 NOTE — Telephone Encounter (Signed)
Spoke to pt and advised, per Dr Glori Bickers, ok to use previous labs for prolia inj

## 2018-10-10 ENCOUNTER — Ambulatory Visit (INDEPENDENT_AMBULATORY_CARE_PROVIDER_SITE_OTHER): Payer: Medicare Other

## 2018-10-10 DIAGNOSIS — M810 Age-related osteoporosis without current pathological fracture: Secondary | ICD-10-CM | POA: Diagnosis not present

## 2018-10-10 MED ORDER — DENOSUMAB 60 MG/ML ~~LOC~~ SOSY
60.0000 mg | PREFILLED_SYRINGE | Freq: Once | SUBCUTANEOUS | Status: AC
  Administered 2018-10-10: 60 mg via SUBCUTANEOUS

## 2018-10-10 NOTE — Progress Notes (Signed)
Per orders of Dr. Glori Bickers, injection of Prolia (first one) given by Kris Mouton. Patient tolerated injection well. Patient was observed for 10 minutes after the injection and had no adverse reaction.

## 2018-11-13 ENCOUNTER — Ambulatory Visit: Payer: Medicare Other | Admitting: Family Medicine

## 2018-11-13 ENCOUNTER — Encounter: Payer: Self-pay | Admitting: Family Medicine

## 2018-11-13 VITALS — BP 138/80 | HR 78 | Temp 97.9°F | Ht 63.5 in | Wt 138.0 lb

## 2018-11-13 DIAGNOSIS — J0100 Acute maxillary sinusitis, unspecified: Secondary | ICD-10-CM

## 2018-11-13 DIAGNOSIS — J019 Acute sinusitis, unspecified: Secondary | ICD-10-CM | POA: Insufficient documentation

## 2018-11-13 DIAGNOSIS — R030 Elevated blood-pressure reading, without diagnosis of hypertension: Secondary | ICD-10-CM | POA: Insufficient documentation

## 2018-11-13 NOTE — Patient Instructions (Signed)
Read about hypertension  Watch your sodium intake and drink water  Get exercise when you feel better   Continue medicines for sinus infection  I'm glad you are feeling better   Have a good trip

## 2018-11-13 NOTE — Progress Notes (Signed)
Subjective:    Patient ID: Kylie Acosta, female    DOB: 1946/07/31, 72 y.o.   MRN: [id number]  HPI  Here for c/o of cough/congestion as well as elevated bp   Was seen in UC and diagnosed with sinusitis and bronchitis (11/29)  Px augmentin  Tessalon proair  Does not feel terrible now  Still coughing at night - but improved  She is supposed to fly on Thursday  No fever    BP there was 146/82 Told to stop taking nyquil and change to clorcidin hbp for nasal symptoms Pulse ox 97% today - on RA  Nasal d/c is clear   Some sneezing- was concerned about allergies  Used to take claritin D     Here BP Readings from Last 3 Encounters:  11/13/18 (!) 152/74  08/22/18 118/62  08/18/18 110/70  improved on 2nd check BP: 138/80     Wt Readings from Last 3 Encounters:  11/13/18 138 lb (62.6 kg)  08/22/18 137 lb 8 oz (62.4 kg)  08/18/18 135 lb (61.2 kg)   24.06 kg/m   Lab Results  Component Value Date   CREATININE 0.77 08/18/2018   BUN 13 08/18/2018   NA 142 08/18/2018   K 4.0 08/18/2018   CL 105 08/18/2018   CO2 32 08/18/2018    Patient Active Problem List   Diagnosis Date Noted  . Acute sinusitis 11/13/2018  . Elevated blood pressure reading 11/13/2018  . Estrogen deficiency 07/24/2018  . Hx of adenomatous colonic polyps 10/26/2017  . Routine general medical examination at a health care facility 07/30/2015  . Vitamin D deficiency 07/30/2015  . Encounter for Medicare annual wellness exam 04/06/2013  . Colon cancer screening 04/06/2013  . GERD 07/07/2010  . VAGINITIS, ATROPHIC 10/15/2009  . Osteoporosis 07/19/2007  . Hyperlipidemia 07/14/2007  . ALLERGIC RHINITIS 07/14/2007  . Eczema of hand 07/14/2007   Past Medical History:  Diagnosis Date  . Allergy    allerlgic rhinitis  . Hyperlipidemia   . Osteopenia   . Osteoporosis    Past Surgical History:  Procedure Laterality Date  . ABDOMINAL HYSTERECTOMY  1997   fibroids  . COLONOSCOPY     Medoff/2005     Social History   Tobacco Use  . Smoking status: Never Smoker  . Smokeless tobacco: Never Used  Substance Use Topics  . Alcohol use: No    Alcohol/week: 0.0 standard drinks  . Drug use: No   Family History  Problem Relation Age of Onset  . Colon cancer Paternal Aunt   . Colon polyps Neg Hx   . Esophageal cancer Neg Hx   . Rectal cancer Neg Hx   . Stomach cancer Neg Hx    Allergies  Allergen Reactions  . Benadryl [Diphenhydramine] Itching    Severe itching. Allergic reaction to cream.  . Alendronate Sodium     REACTION: gerd  . Atorvastatin     REACTION: muscle and joint pain  . Famotidine     REACTION: not effective  . Omeprazole     REACTION: not effective  . Raloxifene     REACTION: hot flashes  . Sulfonamide Derivatives     REACTION: whelps   Current Outpatient Medications on File Prior to Visit  Medication Sig Dispense Refill  . amoxicillin-clavulanate (AUGMENTIN) 875-125 MG tablet Take 1 tablet by mouth 2 (two) times daily.    . benzonatate (TESSALON) 100 MG capsule Take 1 capsule by mouth 3 (three) times daily as needed.  0  .  Calcium Carbonate-Vit D-Min (CALCIUM 1200 PO) Take 1 capsule by mouth daily.    . cholecalciferol (VITAMIN D) 1000 UNITS tablet Take 2,000 Units by mouth daily.     Marland Kitchen desoximetasone (TOPICORT) 0.25 % cream Apply 1 application topically 2 (two) times daily. To eczema on hands 30 g 3  . guaiFENesin (MUCINEX) 600 MG 12 hr tablet Take 1 tablet by mouth 2 (two) times daily as needed.    . Multiple Vitamin (MULTIVITAMIN) capsule Take 1 capsule by mouth daily.    Marland Kitchen PROAIR HFA 108 (90 Base) MCG/ACT inhaler Inhale 1-2 puffs into the lungs every 6 (six) hours as needed.  0  . simvastatin (ZOCOR) 20 MG tablet TAKE 1 TABLET(20 MG) BY MOUTH DAILY 90 tablet 3   Current Facility-Administered Medications on File Prior to Visit  Medication Dose Route Frequency Provider Last Rate Last Dose  . betamethasone acetate-betamethasone sodium phosphate  (CELESTONE) injection 3 mg  3 mg Intramuscular Once Daylene Katayama M, DPM      . betamethasone acetate-betamethasone sodium phosphate (CELESTONE) injection 3 mg  3 mg Intramuscular Once Edrick Kins, DPM        Review of Systems  Constitutional: Positive for appetite change and fatigue. Negative for fever.  HENT: Positive for postnasal drip, rhinorrhea, sinus pressure, sneezing and sore throat. Negative for congestion and ear pain.   Eyes: Negative for pain and discharge.  Respiratory: Positive for cough. Negative for shortness of breath, wheezing and stridor.   Cardiovascular: Negative for chest pain.  Gastrointestinal: Negative for diarrhea, nausea and vomiting.  Genitourinary: Negative for frequency, hematuria and urgency.  Musculoskeletal: Negative for arthralgias and myalgias.  Skin: Negative for rash.  Neurological: Positive for headaches. Negative for dizziness, weakness and light-headedness.  Psychiatric/Behavioral: Negative for confusion and dysphoric mood.       Objective:   Physical Exam  Constitutional: She appears well-developed and well-nourished. No distress.  Well appearing   HENT:  Head: Normocephalic and atraumatic.  Right Ear: External ear normal.  Left Ear: External ear normal.  Mouth/Throat: Oropharynx is clear and moist.  Nares are injected and congested  No sinus tenderness Clear rhinorrhea and post nasal drip   Eyes: Pupils are equal, round, and reactive to light. Conjunctivae and EOM are normal. Right eye exhibits no discharge. Left eye exhibits no discharge.  Neck: Normal range of motion. Neck supple.  Cardiovascular: Normal rate and normal heart sounds.  Pulmonary/Chest: Effort normal and breath sounds normal. No stridor. No respiratory distress. She has no wheezes. She has no rales. She exhibits no tenderness.  Good air exch No wheeze even on forced exp  No rales/rhonchi   Abdominal: Soft. Bowel sounds are normal. She exhibits no distension and no  mass. There is no tenderness.  No renal bruits   Musculoskeletal: She exhibits no edema or tenderness.  Lymphadenopathy:    She has no cervical adenopathy.  Neurological: She is alert.  Skin: Skin is warm and dry. No rash noted.  Psychiatric: She has a normal mood and affect.  Mildly anxious           Assessment & Plan:   Problem List Items Addressed This Visit      Respiratory   Acute sinusitis    Clinical improvement after tx at UC with augmentin and tessalon  Has not needed inhaler  Reassuring exam  Continue current tx  Update if not starting to improve in a week or if worsening        Relevant  Medications   amoxicillin-clavulanate (AUGMENTIN) 875-125 MG tablet   benzonatate (TESSALON) 100 MG capsule   guaiFENesin (MUCINEX) 600 MG 12 hr tablet     Other   Elevated blood pressure reading - Primary    Elevated at UC and here Originally 146/82, then 152/74  (both when feeling sick and somewhat anxious)  Repeat bp reading when more relaxed is improved  BP: 138/80  Will continue to watch Has strong fam hx of HTN -no doubt she may develop it in the future so will follow closely  Disc lifestyle change/ DASH eating- and handout given

## 2018-11-13 NOTE — Assessment & Plan Note (Signed)
Elevated at [facility] and here Originally 146/82, then 152/74  (both when feeling sick and somewhat anxious)  Repeat bp reading when more relaxed is improved  BP: 138/80  Will continue to watch Has strong fam hx of HTN -no doubt she may develop it in the future so will follow closely  Disc lifestyle change/ DASH eating- and handout given

## 2018-11-13 NOTE — Assessment & Plan Note (Addendum)
Clinical improvement after tx at [facility] with augmentin and tessalon  Has not needed inhaler  Reassuring exam  Continue current tx  Update if not starting to improve in a week or if worsening

## 2019-01-05 ENCOUNTER — Ambulatory Visit: Payer: Medicare Other | Admitting: Family Medicine

## 2019-02-10 ENCOUNTER — Other Ambulatory Visit: Payer: Self-pay | Admitting: Family Medicine

## 2019-02-19 ENCOUNTER — Encounter: Payer: Self-pay | Admitting: Family Medicine

## 2019-02-19 ENCOUNTER — Ambulatory Visit (INDEPENDENT_AMBULATORY_CARE_PROVIDER_SITE_OTHER)
Admission: RE | Admit: 2019-02-19 | Discharge: 2019-02-19 | Disposition: A | Discharge status: Home / Self Care | Payer: Medicare Other | Source: Ambulatory Visit | Attending: Family Medicine | Admitting: Family Medicine

## 2019-02-19 ENCOUNTER — Ambulatory Visit: Payer: Medicare Other | Admitting: Family Medicine

## 2019-02-19 VITALS — BP 134/90 | HR 73 | Temp 97.9°F | Ht 63.5 in | Wt 137.6 lb

## 2019-02-19 DIAGNOSIS — W57XXXA Bitten or stung by nonvenomous insect and other nonvenomous arthropods, initial encounter: Secondary | ICD-10-CM | POA: Insufficient documentation

## 2019-02-19 DIAGNOSIS — R1031 Right lower quadrant pain: Secondary | ICD-10-CM | POA: Diagnosis not present

## 2019-02-19 NOTE — Progress Notes (Signed)
Subjective:    Patient ID: Kylie Acosta, female    DOB: 11-15-1946, 73 y.o.   MRN: [id number]  HPI Here for right leg and groin pain   Pain in R groin- occ rad to knee/leg Going down steps is ok but going up is painful  Walking is slower /some limping -favors other leg  Tylenol ES helps some    Had a tick bite Sunday  There is a red spot on lower medial right leg  Itchy  Also has a spot behind knee  Regular size tick  It was attached  Had visited brother in [place] - dogs are in the house   She put abx ointment on it last night  No fever or other symptoms   Patient Active Problem List   Diagnosis Date Noted  . Right groin pain 02/19/2019  . Tick bite 02/19/2019  . Elevated blood pressure reading 11/13/2018  . Estrogen deficiency 07/24/2018  . Hx of adenomatous colonic polyps 10/26/2017  . Routine general medical examination at a health care facility 07/30/2015  . Vitamin D deficiency 07/30/2015  . Encounter for Medicare annual wellness exam 04/06/2013  . Colon cancer screening 04/06/2013  . GERD 07/07/2010  . VAGINITIS, ATROPHIC 10/15/2009  . Osteoporosis 07/19/2007  . Hyperlipidemia 07/14/2007  . ALLERGIC RHINITIS 07/14/2007  . Eczema of hand 07/14/2007   Past Medical History:  Diagnosis Date  . Allergy    allerlgic rhinitis  . Hyperlipidemia   . Osteopenia   . Osteoporosis    Past Surgical History:  Procedure Laterality Date  . ABDOMINAL HYSTERECTOMY  1997   fibroids  . COLONOSCOPY     Medoff/2005   Social History   Tobacco Use  . Smoking status: Never Smoker  . Smokeless tobacco: Never Used  Substance Use Topics  . Alcohol use: No    Alcohol/week: 0.0 standard drinks  . Drug use: No   Family History  Problem Relation Age of Onset  . Colon cancer Paternal Aunt   . Colon polyps Neg Hx   . Esophageal cancer Neg Hx   . Rectal cancer Neg Hx   . Stomach cancer Neg Hx    Allergies  Allergen Reactions  . Benadryl [Diphenhydramine] Itching   Severe itching. Allergic reaction to cream.  . Alendronate Sodium     REACTION: gerd  . Atorvastatin     REACTION: muscle and joint pain  . Famotidine     REACTION: not effective  . Omeprazole     REACTION: not effective  . Raloxifene     REACTION: hot flashes  . Sulfonamide Derivatives     REACTION: whelps   Current Outpatient Medications on File Prior to Visit  Medication Sig Dispense Refill  . Calcium Carbonate-Vit D-Min (CALCIUM 1200 PO) Take 1 capsule by mouth daily.    . cholecalciferol (VITAMIN D) 1000 UNITS tablet Take 2,000 Units by mouth daily.     Marland Kitchen desoximetasone (TOPICORT) 0.25 % cream Apply 1 application topically 2 (two) times daily. To eczema on hands 30 g 3  . Multiple Vitamin (MULTIVITAMIN) capsule Take 1 capsule by mouth daily.    . simvastatin (ZOCOR) 20 MG tablet TAKE 1 TABLET(20 MG) BY MOUTH DAILY 90 tablet 3   No current facility-administered medications on file prior to visit.     Review of Systems  Constitutional: Negative for activity change, appetite change, fatigue, fever and unexpected weight change.  HENT: Negative for congestion, ear pain, rhinorrhea, sinus pressure and sore throat.  Eyes: Negative for pain, redness and visual disturbance.  Respiratory: Negative for cough, shortness of breath and wheezing.   Cardiovascular: Negative for chest pain and palpitations.  Gastrointestinal: Negative for abdominal pain, blood in stool, constipation and diarrhea.  Endocrine: Negative for polydipsia and polyuria.  Genitourinary: Negative for dysuria, frequency and urgency.  Musculoskeletal: Positive for arthralgias. Negative for back pain, joint swelling and myalgias.  Skin: Negative for color change, pallor and rash.  Allergic/Immunologic: Negative for environmental allergies.  Neurological: Negative for dizziness, syncope and headaches.  Hematological: Negative for adenopathy. Does not bruise/bleed easily.  Psychiatric/Behavioral: Negative for decreased  concentration and dysphoric mood. The patient is not nervous/anxious.        Objective:   Physical Exam Constitutional:      General: She is not in acute distress.    Appearance: Normal appearance. She is normal weight. She is not ill-appearing.  HENT:     Head: Normocephalic and atraumatic.     Mouth/Throat:     Mouth: Mucous membranes are moist.     Pharynx: Oropharynx is clear.  Eyes:     General: No scleral icterus.    Conjunctiva/sclera: Conjunctivae normal.     Pupils: Pupils are equal, round, and reactive to light.  Cardiovascular:     Rate and Rhythm: Normal rate and regular rhythm.     Heart sounds: Normal heart sounds.  Pulmonary:     Effort: Pulmonary effort is normal. No respiratory distress.     Breath sounds: Normal breath sounds. No wheezing or rales.  Abdominal:     General: Abdomen is flat. Bowel sounds are normal. There is no distension.     Palpations: Abdomen is soft.     Tenderness: There is no abdominal tenderness.     Comments: No suprapubic tenderness or fullness    Musculoskeletal:     Right hip: She exhibits decreased range of motion. She exhibits normal strength, no tenderness, no bony tenderness, no swelling, no crepitus and no deformity.     Right lower leg: No edema.     Left lower leg: No edema.     Comments: R hip  Full flexion-uncomfortable Pain to internally rotate (slightly less to ext rotate)  No crepitus Nl gait   Nl rom of knee    Lymphadenopathy:     Cervical: No cervical adenopathy.  Skin:    General: Skin is warm and dry.     Capillary Refill: Capillary refill takes less than 2 seconds.     Coloration: Skin is not pale.     Findings: No rash.     Comments: R lower medial leg: 1 cm area of erythema and induration resembling insect bite No vesicle No bullseye rash   Smaller area behind knee   No excoriations   Neurological:     Mental Status: She is alert.     Sensory: No sensory deficit.     Coordination: Coordination  normal.     Deep Tendon Reflexes: Reflexes normal.  Psychiatric:        Mood and Affect: Mood normal.           Assessment & Plan:   Problem List Items Addressed This Visit      Musculoskeletal and Integument   Tick bite    Small on R lower leg- less than 1 cm of erythema  No assoc symptoms  Tick was not att long Enc soap/water  abx ointment Cortisone topical prn itch  ? If lesion behind knee is  the same-no tick seen        Other   Right groin pain - Primary    Suspect poss hip OA  Xray today  Tylenol prn  Aleve if needed with food  Plan to follow after report       Relevant Orders   DG HIP UNILAT WITH PELVIS 2-3 VIEWS RIGHT

## 2019-02-19 NOTE — Assessment & Plan Note (Signed)
Small on R lower leg- less than 1 cm of erythema  No assoc symptoms  Tick was not att long Enc soap/water  abx ointment Cortisone topical prn itch  ? If lesion behind knee is the same-no tick seen

## 2019-02-19 NOTE — Assessment & Plan Note (Signed)
Suspect poss hip OA  Xray today  Tylenol prn  Aleve if needed with food  Plan to follow after report

## 2019-02-19 NOTE — Patient Instructions (Signed)
You can use aleve as needed (along with tylenol)  Take it with food   Xray of hip today  Plan to follow after I get a report   Watch the tick bite  Keep clean-soap and water  Antibiotic ointment once daily  Over the counter cortisone cream once daily   Watch for a bullseye lesion , rash of any kind or fever and let us know

## 2019-02-21 ENCOUNTER — Telehealth: Payer: Self-pay | Admitting: Family Medicine

## 2019-02-21 DIAGNOSIS — R1031 Right lower quadrant pain: Secondary | ICD-10-CM

## 2019-02-21 DIAGNOSIS — M1611 Unilateral primary osteoarthritis, right hip: Secondary | ICD-10-CM

## 2019-02-21 DIAGNOSIS — M169 Osteoarthritis of hip, unspecified: Secondary | ICD-10-CM | POA: Insufficient documentation

## 2019-02-21 NOTE — Telephone Encounter (Signed)
-----   Message from Tammi Sou, [place] sent at 02/21/2019 10:05 AM EDT ----- Pt called back and she does want to see an ortho doc, she said she will do some research on local ortho docs around so once our [facility] calls her she will let them know which one she wants to see

## 2019-02-21 NOTE — Telephone Encounter (Signed)
Called patient and she wants to call us back.

## 2019-03-01 ENCOUNTER — Encounter: Payer: Self-pay | Admitting: Family Medicine

## 2019-04-11 ENCOUNTER — Telehealth: Payer: Self-pay | Admitting: Family Medicine

## 2019-04-11 ENCOUNTER — Telehealth: Payer: Self-pay

## 2019-04-11 NOTE — Telephone Encounter (Signed)
Patient called in to report that she is getting her Prolia injection tomorrow but has started on a new medication: meloxicam and wants to be sure no interaction.  I do not see any potential interaction or concern when I researched but will forward to Dr. Glori Bickers to get her opinion and advice.  Dr. Glori Bickers, okay to continue prolia given patient's start on meloxicam?  I also have left a message for patient to call us back and confirm what dosage of meloxicam she is now on so that I can update her medication record.

## 2019-04-11 NOTE — Telephone Encounter (Signed)
I found no interactions either, thanks

## 2019-04-11 NOTE — Telephone Encounter (Signed)
Spoke w/pt.  Advised her cost for Prolia injection would be approximately $50.  Pt understands and agrees

## 2019-04-12 ENCOUNTER — Ambulatory Visit: Payer: Medicare Other

## 2019-04-12 ENCOUNTER — Other Ambulatory Visit: Payer: Self-pay

## 2019-04-12 DIAGNOSIS — M810 Age-related osteoporosis without current pathological fracture: Secondary | ICD-10-CM | POA: Diagnosis not present

## 2019-04-12 MED ORDER — DENOSUMAB 60 MG/ML ~~LOC~~ SOSY
60.0000 mg | PREFILLED_SYRINGE | Freq: Once | SUBCUTANEOUS | Status: AC
  Administered 2019-04-12: 60 mg via SUBCUTANEOUS

## 2019-04-12 NOTE — Progress Notes (Signed)
Per orders of Dr. Glori Bickers, injection of Prolia given by Kris Mouton. Patient tolerated injection well.  Patient also paid her co pay today for this injection $50. Just wanted to document this in case.

## 2019-05-09 NOTE — Telephone Encounter (Signed)
Patient received her Prolia injection as ordered on 04/12/19.

## 2019-06-29 ENCOUNTER — Encounter: Payer: Self-pay | Admitting: Family Medicine

## 2019-06-29 ENCOUNTER — Other Ambulatory Visit: Payer: Self-pay

## 2019-06-29 ENCOUNTER — Ambulatory Visit: Payer: Medicare Other | Admitting: Family Medicine

## 2019-06-29 ENCOUNTER — Other Ambulatory Visit: Payer: Self-pay | Admitting: Family Medicine

## 2019-06-29 VITALS — BP 138/90 | HR 75 | Temp 97.9°F | Ht 63.5 in | Wt 135.3 lb

## 2019-06-29 DIAGNOSIS — Z01818 Encounter for other preprocedural examination: Secondary | ICD-10-CM | POA: Insufficient documentation

## 2019-06-29 DIAGNOSIS — Z01810 Encounter for preprocedural cardiovascular examination: Secondary | ICD-10-CM

## 2019-06-29 DIAGNOSIS — M1611 Unilateral primary osteoarthritis, right hip: Secondary | ICD-10-CM

## 2019-06-29 DIAGNOSIS — R030 Elevated blood-pressure reading, without diagnosis of hypertension: Secondary | ICD-10-CM

## 2019-06-29 DIAGNOSIS — F430 Acute stress reaction: Secondary | ICD-10-CM | POA: Diagnosis not present

## 2019-06-29 MED ORDER — DESOXIMETASONE 0.25 % EX CREA: 1.0000 "application " | TOPICAL_CREAM | Freq: Two times a day (BID) | CUTANEOUS | 3 refills | Status: DC

## 2019-06-29 MED ORDER — ALPRAZOLAM 0.5 MG PO TABS: 0.5000 mg | ORAL_TABLET | Freq: Every evening | ORAL | 0 refills | Status: DC | PRN

## 2019-06-29 MED ORDER — MELOXICAM 7.5 MG PO TABS: 7.5000 mg | ORAL_TABLET | Freq: Every day | ORAL | 0 refills | Status: DC | PRN

## 2019-06-29 NOTE — Progress Notes (Signed)
Subjective:    Patient ID: Kylie Acosta, female    DOB: Apr 28, 1946, 73 y.o.   MRN: [id number]  HPI  Here for surgical clearance  Planning R total hip replacement/anterior  Less exercise lately due to her hip pain - excited to get back to it  Will have to rehab in a facility   Dr Harlow Mares  Surgery planned 8/18   Wt Readings from Last 3 Encounters:  06/29/19 135 lb 5 oz (61.4 kg)  02/19/19 137 lb 9 oz (62.4 kg)  11/13/18 138 lb (62.6 kg)  wt is stable/ good  23.59 kg/m  BP Readings from Last 3 Encounters:  06/29/19 (!) 142/84  02/19/19 134/90  11/13/18 138/80   Re check BP: 138/90     Pulse Readings from Last 3 Encounters:  06/29/19 75  02/19/19 73  11/13/18 78    Lab Results  Component Value Date   CREATININE 0.77 08/18/2018   BUN 13 08/18/2018   NA 142 08/18/2018   K 4.0 08/18/2018   CL 105 08/18/2018   CO2 32 08/18/2018   Lab Results  Component Value Date   ALT 18 08/18/2018   AST 23 08/18/2018   ALKPHOS 51 08/18/2018   BILITOT 0.6 08/18/2018   Lab Results  Component Value Date   WBC 5.3 08/18/2018   HGB 13.8 08/18/2018   HCT 40.6 08/18/2018   MCV 94.9 08/18/2018   PLT 266.0 08/18/2018    Last glucose fasting 66   No h/o low protein No blood sugar problems  Non smoker   EKG rate of 73 with NSR  No acute changes   Took meloxicam for her hip for a while  Gave her some acid reflux and heartburn-worse at night (she got worried)  1/2 pill was better tolerated    Pulmonary hx-none No breathing problems   Cardiac hx-none  No problems   Allergies - is allergic to benadryl  Gets swelling   Anesthesia hx -she reacts strongly mental status wise   She is anxious about surgery- may need something for anxiety the week before   Patient Active Problem List   Diagnosis Date Noted   Pre-operative clearance 06/29/2019   Stress reaction 06/29/2019   Degenerative joint disease (DJD) of hip 02/21/2019   Right groin pain 02/19/2019    Elevated blood pressure reading 11/13/2018   Estrogen deficiency 07/24/2018   Hx of adenomatous colonic polyps 10/26/2017   Routine general medical examination at a health care facility 07/30/2015   Vitamin D deficiency 07/30/2015   Encounter for Medicare annual wellness exam 04/06/2013   Colon cancer screening 04/06/2013   GERD 07/07/2010   VAGINITIS, ATROPHIC 10/15/2009   Osteoporosis 07/19/2007   Hyperlipidemia 07/14/2007   ALLERGIC RHINITIS 07/14/2007   Eczema of hand 07/14/2007   Past Medical History:  Diagnosis Date   Allergy    allerlgic rhinitis   Hyperlipidemia    Osteopenia    Osteoporosis    Past Surgical History:  Procedure Laterality Date   ABDOMINAL HYSTERECTOMY  1997   fibroids   COLONOSCOPY     Medoff/2005   Social History   Tobacco Use   Smoking status: Never Smoker   Smokeless tobacco: Never Used  Substance Use Topics   Alcohol use: No    Alcohol/week: 0.0 standard drinks   Drug use: No   Family History  Problem Relation Age of Onset   Colon cancer Paternal Aunt    Colon polyps Neg Hx    Esophageal cancer Neg  Hx    Rectal cancer Neg Hx    Stomach cancer Neg Hx    Allergies  Allergen Reactions   Benadryl [Diphenhydramine] Itching    Severe itching. Allergic reaction to cream.   Alendronate Sodium     REACTION: gerd   Atorvastatin     REACTION: muscle and joint pain   Famotidine     REACTION: not effective   Omeprazole     REACTION: not effective   Raloxifene     REACTION: hot flashes   Sulfonamide Derivatives     REACTION: whelps   Current Outpatient Medications on File Prior to Visit  Medication Sig Dispense Refill   Calcium Carbonate-Vit D-Min (CALCIUM 1200 PO) Take 1 capsule by mouth daily.     cholecalciferol (VITAMIN D) 1000 UNITS tablet Take 2,000 Units by mouth daily.      Multiple Vitamin (MULTIVITAMIN) capsule Take 1 capsule by mouth daily.     simvastatin (ZOCOR) 20 MG tablet TAKE 1  TABLET(20 MG) BY MOUTH DAILY 90 tablet 3   No current facility-administered medications on file prior to visit.     Review of Systems  Constitutional: Negative for activity change, appetite change, fatigue, fever and unexpected weight change.  HENT: Negative for congestion, ear pain, rhinorrhea, sinus pressure and sore throat.   Eyes: Negative for pain, redness and visual disturbance.  Respiratory: Negative for cough, shortness of breath and wheezing.   Cardiovascular: Negative for chest pain and palpitations.  Gastrointestinal: Negative for abdominal pain, blood in stool, constipation and diarrhea.  Endocrine: Negative for polydipsia and polyuria.  Genitourinary: Negative for dysuria, frequency and urgency.  Musculoskeletal: Positive for arthralgias. Negative for back pain and myalgias.       Right hip pain   Skin: Negative for pallor and rash.  Allergic/Immunologic: Negative for environmental allergies.  Neurological: Negative for dizziness, syncope and headaches.  Hematological: Negative for adenopathy. Does not bruise/bleed easily.  Psychiatric/Behavioral: Negative for decreased concentration and dysphoric mood. The patient is not nervous/anxious.        Objective:   Physical Exam Constitutional:      General: She is not in acute distress.    Appearance: Normal appearance. She is well-developed and normal weight. She is not ill-appearing or diaphoretic.  HENT:     Head: Normocephalic and atraumatic.     Nose: Nose normal.     Mouth/Throat:     Mouth: Mucous membranes are moist.     Pharynx: Oropharynx is clear.  Eyes:     General: No scleral icterus.    Conjunctiva/sclera: Conjunctivae normal.     Pupils: Pupils are equal, round, and reactive to light.  Neck:     Musculoskeletal: Normal range of motion and neck supple. No neck rigidity or muscular tenderness.     Thyroid: No thyromegaly.     Vascular: No carotid bruit or JVD.  Cardiovascular:     Rate and Rhythm: Normal  rate and regular rhythm.     Pulses: Normal pulses.     Heart sounds: Normal heart sounds. No gallop.   Pulmonary:     Effort: Pulmonary effort is normal. No respiratory distress.     Breath sounds: Normal breath sounds. No wheezing or rales.  Abdominal:     General: Bowel sounds are normal. There is no distension or abdominal bruit.     Palpations: Abdomen is soft. There is no mass.     Tenderness: There is no abdominal tenderness.  Musculoskeletal:     Right  lower leg: No edema.     Left lower leg: No edema.  Lymphadenopathy:     Cervical: No cervical adenopathy.  Skin:    General: Skin is warm and dry.     Findings: No rash.  Neurological:     Mental Status: She is alert. Mental status is at baseline.     Motor: No weakness.     Coordination: Coordination normal.     Gait: Gait normal.     Deep Tendon Reflexes: Reflexes are normal and symmetric. Reflexes normal.  Psychiatric:        Mood and Affect: Mood normal.           Assessment & Plan:   Problem List Items Addressed This Visit      Musculoskeletal and Integument   Degenerative joint disease (DJD) of hip    Total hip replacement planned        Other   Elevated blood pressure reading    Watching closely  Better on 2nd check       Pre-operative clearance - Primary    No restrictions for upcoming hip repl surgery  Noted -allergy to benadryl Also strong mental status rxn to anesthesia in the past  She will hold nsaid for a week before surgery  No active cardiac/pulmonary problems  Given xanax to take prn anxiety the week before surgery       Stress reaction    Anxious about upcoming hip repl surgery  Given px for xanax/ short term to use the week before Caution of sedation/habit/falls      Relevant Medications   ALPRAZolam (XANAX) 0.5 MG tablet    Other Visit Diagnoses    Pre-operative cardiovascular examination       Relevant Orders   EKG 12-Lead (Completed)

## 2019-06-29 NOTE — Telephone Encounter (Signed)
Pharmacy is requesting Rx be changed to a 90 day supply. You sent in #[phone] at her pre op appt, please advise

## 2019-06-29 NOTE — Patient Instructions (Addendum)
Stop your meloxicam 7-10 days before surgery   Take xanax as needed at bedtime the week before surgery -caution of sedation and dizziness  No restrictions for surgery  I will send paperwork

## 2019-07-01 NOTE — Assessment & Plan Note (Signed)
Watching closely  Better on 2nd check

## 2019-07-01 NOTE — Assessment & Plan Note (Signed)
Total hip replacement planned

## 2019-07-01 NOTE — Assessment & Plan Note (Signed)
Anxious about upcoming hip repl surgery  Given px for xanax/ short term to use the week before Caution of sedation/habit/falls

## 2019-07-01 NOTE — Assessment & Plan Note (Signed)
No restrictions for upcoming hip repl surgery  Noted -allergy to benadryl Also strong mental status rxn to anesthesia in the past  She will hold nsaid for a week before surgery  No active cardiac/pulmonary problems  Given xanax to take prn anxiety the week before surgery

## 2019-07-26 IMAGING — DX DG HIP (WITH OR WITHOUT PELVIS) 2-3V RIGHT
3 series · 3 of 3 positions shown · non-contrast
Comparison: None.

CLINICAL DATA: 72 y/o F; right groin pain worse with head movement.

EXAM:
DG HIP (WITH OR WITHOUT PELVIS) 2-3V RIGHT

[pelvis ap]
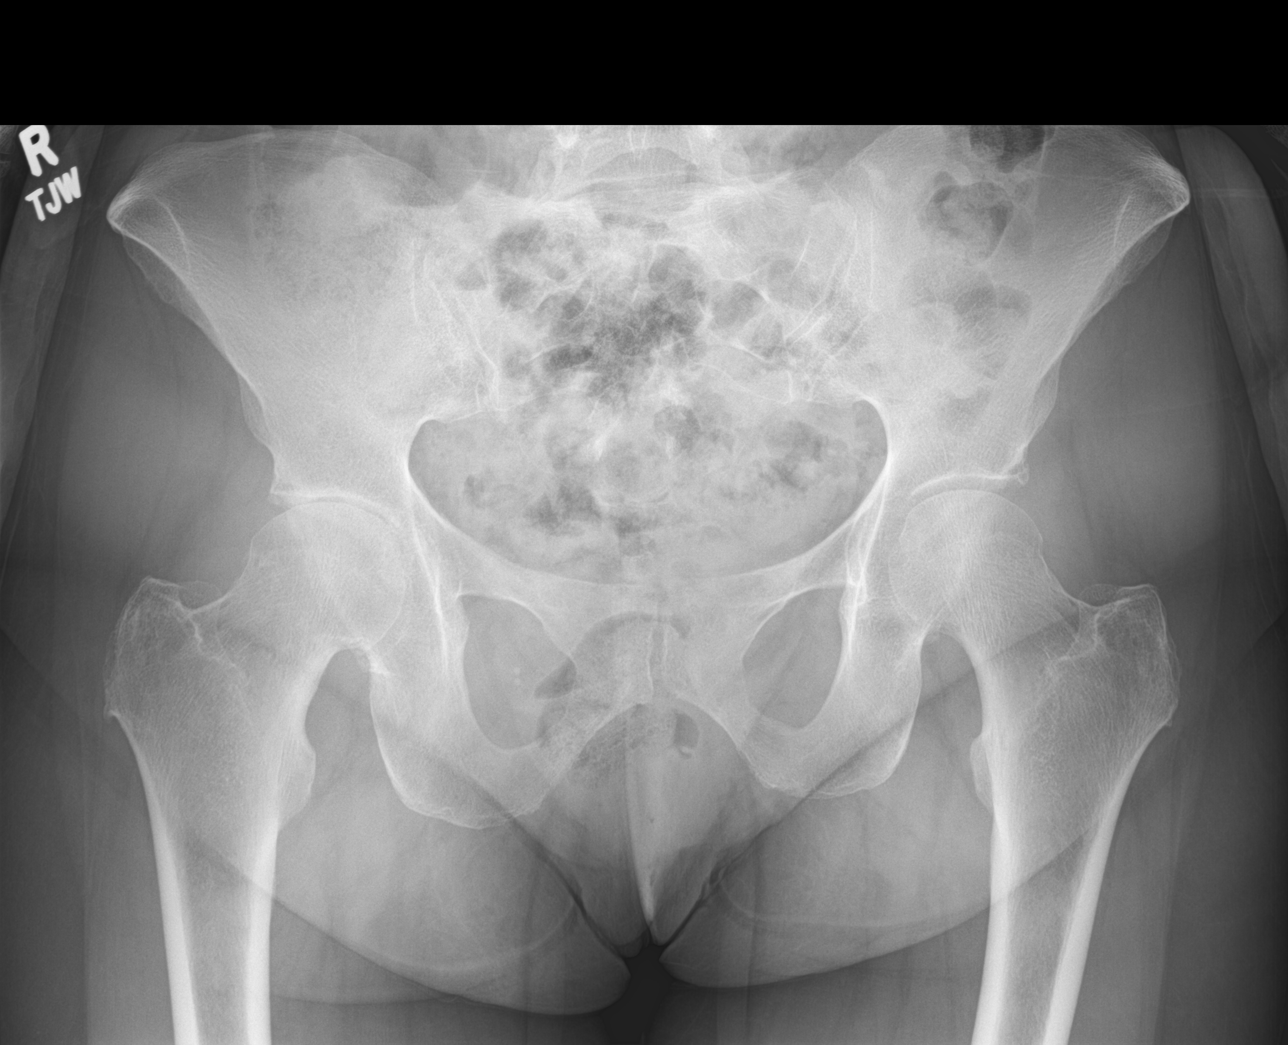

[hip ap]
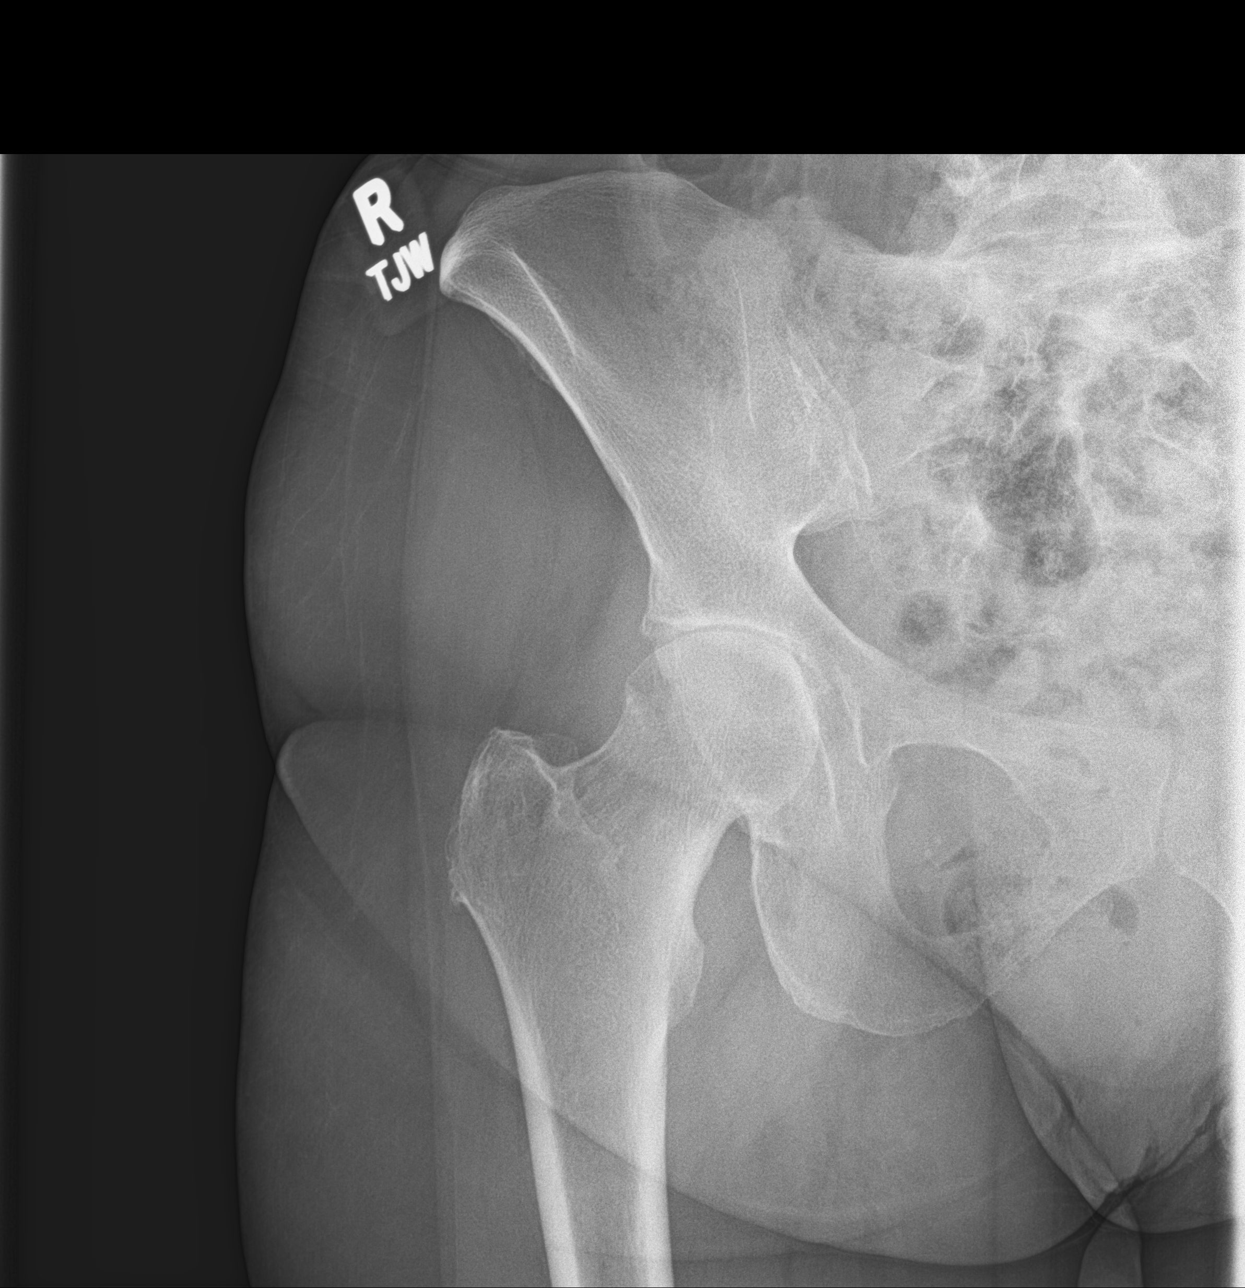

[hip lat]
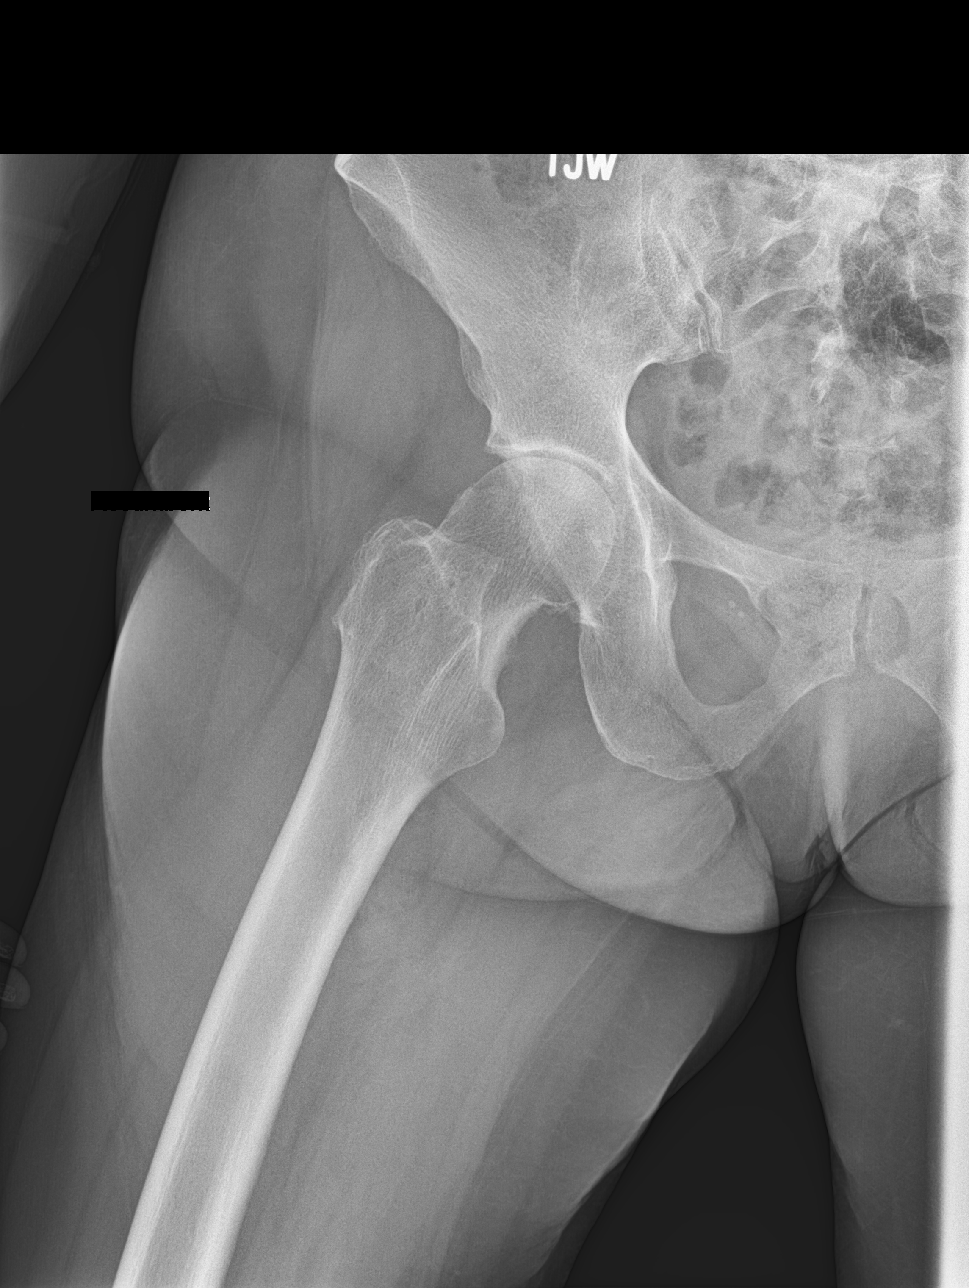

[3 of 3 positions shown; findings below may reference images not displayed]

FINDINGS: No acute fracture or dislocation. No pelvic diastases. Moderate loss
of the right femur superior joint space. Left hip joint is
maintained.
IMPRESSION: 1. No acute fracture or dislocation.
2. Right hip osteoarthrosis with moderate loss of the superior joint
space.

## 2019-07-27 ENCOUNTER — Other Ambulatory Visit: Payer: Self-pay | Admitting: Family Medicine

## 2019-08-14 HISTORY — PX: HIP SURGERY: SHX245

## 2019-08-27 ENCOUNTER — Other Ambulatory Visit: Payer: Self-pay | Admitting: Family Medicine

## 2019-08-31 ENCOUNTER — Ambulatory Visit: Payer: Medicare Other

## 2019-08-31 ENCOUNTER — Telehealth: Payer: Self-pay | Admitting: Family Medicine

## 2019-08-31 NOTE — Telephone Encounter (Signed)
Prolia benefit verification request sent to [employer] for Prolia.

## 2019-09-04 ENCOUNTER — Telehealth: Payer: Self-pay | Admitting: Family Medicine

## 2019-09-04 NOTE — Telephone Encounter (Signed)
Prolia benefits submitted to [employer].

## 2019-09-07 ENCOUNTER — Encounter: Payer: Medicare Other | Admitting: Family Medicine

## 2019-09-13 ENCOUNTER — Telehealth: Payer: Self-pay | Admitting: Family Medicine

## 2019-09-13 DIAGNOSIS — M810 Age-related osteoporosis without current pathological fracture: Secondary | ICD-10-CM

## 2019-09-13 NOTE — Telephone Encounter (Signed)
Done. Thanks.

## 2019-09-13 NOTE — Telephone Encounter (Signed)
Pt due for Prolia injection 10-13-2019. Can you put in order for calcium level?

## 2019-09-20 ENCOUNTER — Telehealth: Payer: Self-pay | Admitting: Family Medicine

## 2019-09-20 NOTE — Telephone Encounter (Signed)
Patient had surgery recently and is still in rehab.  Patient said she's due for her prolia on 10/11/19. Please call back about scheduling appointment. She's aware Charmaine will be off until Tuesday.

## 2019-09-26 NOTE — Telephone Encounter (Signed)
I would wait for a few months after surgery  Thanks

## 2019-09-26 NOTE — Telephone Encounter (Signed)
LMOM to inform pt need to wait a few months for next Prolia.  (pt has appt w/Dr. Glori Bickers 11-02-2019)

## 2019-09-26 NOTE — Telephone Encounter (Signed)
Discussed Prolia benefits w/pt.  No PA rqd, Pt owes approximately $70 ($50 for Prolia and $20 for admin fee).  Pt agrees.   Pt will need calcium labs before next Prolia injection. Order on file. Pt had total hip surgery 08-24-2019.  How long should she wait before she has Prolia?

## 2019-09-27 NOTE — Telephone Encounter (Signed)
Pt informed to wait on doing Prolia and will discuss@next  OV.

## 2019-10-01 ENCOUNTER — Ambulatory Visit: Payer: Medicare Other

## 2019-10-03 ENCOUNTER — Encounter: Payer: Medicare Other | Admitting: Family Medicine

## 2019-10-25 ENCOUNTER — Telehealth: Payer: Self-pay | Admitting: Family Medicine

## 2019-10-25 DIAGNOSIS — M810 Age-related osteoporosis without current pathological fracture: Secondary | ICD-10-CM

## 2019-10-25 DIAGNOSIS — E7800 Pure hypercholesterolemia, unspecified: Secondary | ICD-10-CM

## 2019-10-25 DIAGNOSIS — E559 Vitamin D deficiency, unspecified: Secondary | ICD-10-CM

## 2019-10-25 DIAGNOSIS — Z0000 Encounter for general adult medical examination without abnormal findings: Secondary | ICD-10-CM

## 2019-10-25 NOTE — Telephone Encounter (Signed)
-----   Message from Ellamae Sia sent at 10/22/2019  4:09 PM EST ----- Regarding: Lab orders for Monday, 11.16.20 Patient is scheduled for CPX labs, please order future labs, Thanks , Karna Christmas

## 2019-10-29 ENCOUNTER — Other Ambulatory Visit (INDEPENDENT_AMBULATORY_CARE_PROVIDER_SITE_OTHER): Payer: Medicare Other

## 2019-10-29 ENCOUNTER — Ambulatory Visit (INDEPENDENT_AMBULATORY_CARE_PROVIDER_SITE_OTHER): Payer: Medicare Other

## 2019-10-29 DIAGNOSIS — E559 Vitamin D deficiency, unspecified: Secondary | ICD-10-CM | POA: Diagnosis not present

## 2019-10-29 DIAGNOSIS — Z0000 Encounter for general adult medical examination without abnormal findings: Secondary | ICD-10-CM | POA: Diagnosis not present

## 2019-10-29 DIAGNOSIS — E7800 Pure hypercholesterolemia, unspecified: Secondary | ICD-10-CM | POA: Diagnosis not present

## 2019-10-29 LAB — CBC WITH DIFFERENTIAL/PLATELET
Basophils Absolute: 0 10*3/uL (ref 0.0–0.1)
Basophils Relative: 0.7 % (ref 0.0–3.0)
Eosinophils Absolute: 0.1 10*3/uL (ref 0.0–0.7)
Eosinophils Relative: 2.4 % (ref 0.0–5.0)
HCT: 39.8 % (ref 36.0–46.0)
Hemoglobin: 13.3 g/dL (ref 12.0–15.0)
Lymphocytes Relative: 27.8 % (ref 12.0–46.0)
Lymphs Abs: 1.6 10*3/uL (ref 0.7–4.0)
MCHC: 33.5 g/dL (ref 30.0–36.0)
MCV: 94.4 fl (ref 78.0–100.0)
Monocytes Absolute: 0.4 10*3/uL (ref 0.1–1.0)
Monocytes Relative: 7.4 % (ref 3.0–12.0)
Neutro Abs: 3.5 10*3/uL (ref 1.4–7.7)
Neutrophils Relative %: 61.7 % (ref 43.0–77.0)
Platelets: 279 10*3/uL (ref 150.0–400.0)
RBC: 4.21 Mil/uL (ref 3.87–5.11)
RDW: 12.3 % (ref 11.5–15.5)
WBC: 5.7 10*3/uL (ref 4.0–10.5)

## 2019-10-29 LAB — LIPID PANEL
Cholesterol: 173 mg/dL (ref 0–200)
HDL: 64.8 mg/dL (ref >39.00)
LDL Cholesterol: 87 mg/dL (ref 0–99)
NonHDL: 108.16
Total CHOL/HDL Ratio: 3
Triglycerides: 106 mg/dL (ref 0.0–149.0)
VLDL: 21.2 mg/dL (ref 0.0–40.0)

## 2019-10-29 LAB — COMPREHENSIVE METABOLIC PANEL
ALT: 15 U/L (ref 0–35)
AST: 21 U/L (ref 0–37)
Albumin: 4.5 g/dL (ref 3.5–5.2)
Alkaline Phosphatase: 42 U/L (ref 39–117)
BUN: 12 mg/dL (ref 6–23)
CO2: 29 mEq/L (ref 19–32)
Calcium: 9.1 mg/dL (ref 8.4–10.5)
Chloride: 101 mEq/L (ref 96–112)
Creatinine, Ser: 0.72 mg/dL (ref 0.40–1.20)
GFR: 78.83 mL/min (ref >60.00)
Glucose, Bld: 99 mg/dL (ref 70–99)
Potassium: 4.3 mEq/L (ref 3.5–5.1)
Sodium: 137 mEq/L (ref 135–145)
Total Bilirubin: 0.7 mg/dL (ref 0.2–1.2)
Total Protein: 7 g/dL (ref 6.0–8.3)

## 2019-10-29 LAB — TSH: TSH: 2.84 u[IU]/mL (ref 0.35–4.50)

## 2019-10-29 LAB — VITAMIN D 25 HYDROXY (VIT D DEFICIENCY, FRACTURES): VITD: 53.85 ng/mL (ref 30.00–100.00)

## 2019-10-29 NOTE — Patient Instructions (Signed)
Kylie Acosta , Thank you for taking time to come for your Medicare Wellness Visit. I appreciate your ongoing commitment to your health goals. Please review the following plan we discussed and let me know if I can assist you in the future.   Screening recommendations/referrals: Colonoscopy: Up to date, completed 10/21/2017 Mammogram: scheduled for 11/02/2019 Bone Density: Up to date, completed 08/09/2018 Recommended yearly ophthalmology/optometry visit for glaucoma screening and checkup Recommended yearly dental visit for hygiene and checkup  Vaccinations: Influenza vaccine: will get at physical  Pneumococcal vaccine: Completed series Tdap vaccine: Up to date, completed 04/06/2013 Shingles vaccine: will check with insurance    Advanced directives: copy in chart  Conditions/risks identified: hyperlipidemia  Next appointment: 11/02/2019 @ 11:30 am    Preventive Care 62 Years and Older, Female Preventive care refers to lifestyle choices and visits with your health care provider that can promote health and wellness. What does preventive care include?  A yearly physical exam. This is also called an annual well check.  Dental exams once or twice a year.  Routine eye exams. Ask your health care provider how often you should have your eyes checked.  Personal lifestyle choices, including:  Daily care of your teeth and gums.  Regular physical activity.  Eating a healthy diet.  Avoiding tobacco and drug use.  Limiting alcohol use.  Practicing safe sex.  Taking low-dose aspirin every day.  Taking vitamin and mineral supplements as recommended by your health care provider. What happens during an annual well check? The services and screenings done by your health care provider during your annual well check will depend on your age, overall health, lifestyle risk factors, and family history of disease. Counseling  Your health care provider may ask you questions about your:  Alcohol  use.  Tobacco use.  Drug use.  Emotional well-being.  Home and relationship well-being.  Sexual activity.  Eating habits.  History of falls.  Memory and ability to understand (cognition).  Work and work Statistician.  Reproductive health. Screening  You may have the following tests or measurements:  Height, weight, and BMI.  Blood pressure.  Lipid and cholesterol levels. These may be checked every 5 years, or more frequently if you are over 52 years old.  Skin check.  Lung cancer screening. You may have this screening every year starting at age 60 if you have a 30-pack-year history of smoking and currently smoke or have quit within the past 15 years.  Fecal occult blood test (FOBT) of the stool. You may have this test every year starting at age 72.  Flexible sigmoidoscopy or colonoscopy. You may have a sigmoidoscopy every 5 years or a colonoscopy every 10 years starting at age 84.  Hepatitis C blood test.  Hepatitis B blood test.  Sexually transmitted disease (STD) testing.  Diabetes screening. This is done by checking your blood sugar (glucose) after you have not eaten for a while (fasting). You may have this done every 1-3 years.  Bone density scan. This is done to screen for osteoporosis. You may have this done starting at age 45.  Mammogram. This may be done every 1-2 years. Talk to your health care provider about how often you should have regular mammograms. Talk with your health care provider about your test results, treatment options, and if necessary, the need for more tests. Vaccines  Your health care provider may recommend certain vaccines, such as:  Influenza vaccine. This is recommended every year.  Tetanus, diphtheria, and acellular pertussis (Tdap,  Td) vaccine. You may need a Td booster every 10 years.  Zoster vaccine. You may need this after age 21.  Pneumococcal 13-valent conjugate (PCV13) vaccine. One dose is recommended after age 53.   Pneumococcal polysaccharide (PPSV23) vaccine. One dose is recommended after age 32. Talk to your health care provider about which screenings and vaccines you need and how often you need them. This information is not intended to replace advice given to you by your health care provider. Make sure you discuss any questions you have with your health care provider. Document Released: 12/26/2015 Document Revised: 08/18/2016 Document Reviewed: 09/30/2015 Elsevier Interactive Patient Education  2017 [place] Prevention in the Home Falls can cause injuries. They can happen to people of all ages. There are many things you can do to make your home safe and to help prevent falls. What can I do on the outside of my home?  Regularly fix the edges of walkways and driveways and fix any cracks.  Remove anything that might make you trip as you walk through a door, such as a raised step or threshold.  Trim any bushes or trees on the path to your home.  Use bright outdoor lighting.  Clear any walking paths of anything that might make someone trip, such as rocks or tools.  Regularly check to see if handrails are loose or broken. Make sure that both sides of any steps have handrails.  Any raised decks and porches should have guardrails on the edges.  Have any leaves, snow, or ice cleared regularly.  Use sand or salt on walking paths during winter.  Clean up any spills in your garage right away. This includes oil or grease spills. What can I do in the bathroom?  Use night lights.  Install grab bars by the toilet and in the tub and shower. Do not use towel bars as grab bars.  Use non-skid mats or decals in the tub or shower.  If you need to sit down in the shower, use a plastic, non-slip stool.  Keep the floor dry. Clean up any water that spills on the floor as soon as it happens.  Remove soap buildup in the tub or shower regularly.  Attach bath mats securely with double-sided non-slip  rug tape.  Do not have throw rugs and other things on the floor that can make you trip. What can I do in the bedroom?  Use night lights.  Make sure that you have a light by your bed that is easy to reach.  Do not use any sheets or blankets that are too big for your bed. They should not hang down onto the floor.  Have a firm chair that has side arms. You can use this for support while you get dressed.  Do not have throw rugs and other things on the floor that can make you trip. What can I do in the kitchen?  Clean up any spills right away.  Avoid walking on wet floors.  Keep items that you use a lot in easy-to-reach places.  If you need to reach something above you, use a strong step stool that has a grab bar.  Keep electrical cords out of the way.  Do not use floor polish or wax that makes floors slippery. If you must use wax, use non-skid floor wax.  Do not have throw rugs and other things on the floor that can make you trip. What can I do with my stairs?  Do not  leave any items on the stairs.  Make sure that there are handrails on both sides of the stairs and use them. Fix handrails that are broken or loose. Make sure that handrails are as long as the stairways.  Check any carpeting to make sure that it is firmly attached to the stairs. Fix any carpet that is loose or worn.  Avoid having throw rugs at the top or bottom of the stairs. If you do have throw rugs, attach them to the floor with carpet tape.  Make sure that you have a light switch at the top of the stairs and the bottom of the stairs. If you do not have them, ask someone to add them for you. What else can I do to help prevent falls?  Wear shoes that:  Do not have high heels.  Have rubber bottoms.  Are comfortable and fit you well.  Are closed at the toe. Do not wear sandals.  If you use a stepladder:  Make sure that it is fully opened. Do not climb a closed stepladder.  Make sure that both sides of  the stepladder are locked into place.  Ask someone to hold it for you, if possible.  Clearly mark and make sure that you can see:  Any grab bars or handrails.  First and last steps.  Where the edge of each step is.  Use tools that help you move around (mobility aids) if they are needed. These include:  Canes.  Walkers.  Scooters.  Crutches.  Turn on the lights when you go into a dark area. Replace any light bulbs as soon as they burn out.  Set up your furniture so you have a clear path. Avoid moving your furniture around.  If any of your floors are uneven, fix them.  If there are any pets around you, be aware of where they are.  Review your medicines with your doctor. Some medicines can make you feel dizzy. This can increase your chance of falling. Ask your doctor what other things that you can do to help prevent falls. This information is not intended to replace advice given to you by your health care provider. Make sure you discuss any questions you have with your health care provider. Document Released: 09/25/2009 Document Revised: 05/06/2016 Document Reviewed: 01/03/2015 Elsevier Interactive Patient Education  2017 [employer].

## 2019-10-29 NOTE — Progress Notes (Signed)
Subjective:   Kylie Acosta is a 73 y.o. female who presents for Medicare Annual (Subsequent) preventive examination.  Review of Systems: N/A   This visit is being conducted through telemedicine via telephone at the nurse health advisor's home address due to the COVID-19 pandemic. This patient has given me verbal consent via doximity to conduct this visit, patient states they are participating from their home address. Patient and myself are on the telephone call. There is no referral for this visit. Some vital signs may be absent or patient reported.    Patient identification: identified by name, DOB, and current address   Cardiac Risk Factors include: advanced age (>48men, >33 women);dyslipidemia     Objective:     Vitals: There were no vitals taken for this visit.  There is no height or weight on file to calculate BMI.  Advanced Directives 10/29/2019 08/18/2018 10/21/2017 10/11/2017 08/09/2017 07/08/2016  Does Patient Have a Medical Advance Directive? Yes Yes Yes Yes Yes Yes  Type of Paramedic of [place];Living will [place];Living will [place];Living will [place];Living will [place];Living will [place];Living will  Does patient want to make changes to medical advance directive? - - - - No - Patient declined No - Patient declined  Copy of [place] in Chart? Yes - validated most recent copy scanned in chart (See row information) Yes - - No - copy requested No - copy requested  Would patient like information on creating a medical advance directive? - No - Patient declined - - - -    Tobacco Social History   Tobacco Use  Smoking Status Never Smoker  Smokeless Tobacco Never Used     Counseling given: Not Answered   Clinical Intake:  Pre-visit preparation completed: Yes  Pain : No/denies pain     Nutritional Risks:  None Diabetes: No  How often do you need to have someone help you when you read instructions, pamphlets, or other written materials from your doctor or pharmacy?: 1 - Never What is the last grade level you completed in school?: Masters  Interpreter Needed?: No  Information entered by :: CJohnson, LPN  Past Medical History:  Diagnosis Date   Allergy    allerlgic rhinitis   Hyperlipidemia    Osteopenia    Osteoporosis    Past Surgical History:  Procedure Laterality Date   ABDOMINAL HYSTERECTOMY  1997   fibroids   COLONOSCOPY     [place]/2005   HIP SURGERY  08/14/2019   Family History  Problem Relation Age of Onset   Colon cancer Paternal Aunt    Colon polyps Neg Hx    Esophageal cancer Neg Hx    Rectal cancer Neg Hx    Stomach cancer Neg Hx    Social History   Socioeconomic History   Marital status: Married    Spouse name: Not on file   Number of children: Not on file   Years of education: Not on file   Highest education level: Not on file  Occupational History   Not on file  Social Needs   Financial resource strain: Not hard at all   Food insecurity    Worry: Never true    Inability: Never true   Transportation needs    Medical: No    Non-medical: No  Tobacco Use   Smoking status: Never Smoker   Smokeless tobacco: Never Used  Substance and Sexual Activity  Alcohol use: No    Alcohol/week: 0.0 standard drinks   Drug use: No   Sexual activity: Never  Lifestyle   Physical activity    Days per week: 0 days    Minutes per session: 0 min   Stress: Not at all  Relationships   Social connections    Talks on phone: Not on file    Gets together: Not on file    Attends religious service: Not on file    Active member of club or organization: Not on file    Attends meetings of clubs or organizations: Not on file    Relationship status: Not on file  Other Topics Concern   Not on file  Social History Narrative   Not on file     Outpatient Encounter Medications as of 10/29/2019  Medication Sig   ALPRAZolam (XANAX) 0.5 MG tablet Take 1 tablet (0.5 mg total) by mouth at bedtime as needed for anxiety. As needed the week before surgery   Calcium Carbonate-Vit D-Min (CALCIUM 1200 PO) Take 1 capsule by mouth daily.   cholecalciferol (VITAMIN D) 1000 UNITS tablet Take 2,000 Units by mouth daily.    denosumab (PROLIA) 60 MG/ML SOSY injection Inject 60 mg into the skin every 6 (six) months.   desoximetasone (TOPICORT) 0.25 % cream Apply 1 application topically 2 (two) times daily. To eczema on hands   fluticasone (FLONASE) 50 MCG/ACT nasal spray fluticasone propionate 50 mcg/actuation nasal spray,suspension   meloxicam (MOBIC) 7.5 MG tablet TAKE 1 TABLET(7.5 MG) BY MOUTH DAILY WITH A MEAL AS NEEDED FOR PAIN   Multiple Vitamin (MULTIVITAMIN) capsule Take 1 capsule by mouth daily.   simvastatin (ZOCOR) 20 MG tablet TAKE 1 TABLET(20 MG) BY MOUTH DAILY   No facility-administered encounter medications on file as of 10/29/2019.     Activities of Daily Living In your present state of health, do you have any difficulty performing the following activities: 10/29/2019  Hearing? N  Vision? N  Difficulty concentrating or making decisions? N  Walking or climbing stairs? N  Dressing or bathing? N  Doing errands, shopping? N  Preparing Food and eating ? N  Using the Toilet? N  In the past six months, have you accidently leaked urine? N  Do you have problems with loss of bowel control? N  Managing your Medications? N  Managing your Finances? N  Housekeeping or managing your Housekeeping? N  Some recent data might be hidden    Patient Care Team: Tower, Wynelle Fanny, MD as PCP - General Ralene Bathe, MD as Referring Physician (Dermatology) Garfield Cornea, OD as Referring Physician (Optometry)    Assessment:   This is a routine wellness examination for Kylie Acosta.  Exercise Activities and Dietary  recommendations Current Exercise Habits: Home exercise routine, Type of exercise: walking, Time (Minutes): 60, Frequency (Times/Week): 7, Weekly Exercise (Minutes/Week): 420, Intensity: Mild, Exercise limited by: None identified  Goals     Increase physical activity     Starting 08/18/2018, I will continue to walk at least 30 minutes daily.      Patient Stated     10/29/2019, I will continue to walk and ride my exercise bike at least 6 miles a day.        Fall Risk Fall Risk  10/29/2019 08/18/2018 08/09/2017 07/08/2016 08/04/2015  Falls in the past year? 1 No No Yes No  Comment fell off a stool - - - -  Number falls in past yr: 0 - - 1 -  Injury with Fall? 0 - - No -  Risk for fall due to : Medication side effect;Impaired mobility - - - -  Follow up Falls evaluation completed;Falls prevention discussed - - Falls evaluation completed -   Is the patient's home free of loose throw rugs in walkways, pet beds, electrical cords, etc?   no      Grab bars in the bathroom? no      Handrails on the stairs?   no      Adequate lighting?   no  Timed Get Up and Go performed: N/A  Depression Screen PHQ 2/9 Scores 10/29/2019 08/18/2018 08/09/2017 07/08/2016  PHQ - 2 Score 0 0 0 0  PHQ- 9 Score 0 0 0 -     Cognitive Function MMSE - Mini Mental State Exam 10/29/2019 08/18/2018 08/09/2017 07/08/2016  Orientation to time 5 5 5 5   Orientation to Place 5 5 5 5   Registration 3 3 3 3   Attention/ Calculation 5 0 0 0  Recall 3 3 3 3   Language- name 2 objects - 0 0 0  Language- repeat 1 1 1 1   Language- follow 3 step command - 3 3 3   Language- read & follow direction - 0 0 0  Write a sentence - 0 0 0  Copy design - 0 0 0  Total score - 20 20 20   Mini Cog  Mini-Cog screen was completed. Maximum score is 22. A value of 0 denotes this part of the MMSE was not completed or the patient failed this part of the Mini-Cog screening.       Immunization History  Administered Date(s) Administered   Influenza  Split 11/30/2011, 11/29/2012   Influenza Whole 09/12/2002, 10/11/2008, 10/07/2009, 10/22/2010   Influenza,inj,Quad PF,6+ Mos 11/07/2013, 10/22/2015, 10/08/2016, 10/13/2017, 08/22/2018   Pneumococcal Conjugate-13 05/01/2014   Pneumococcal Polysaccharide-23 01/11/2012   Td 10/14/2003, 04/06/2013   Zoster 07/24/2007    Qualifies for Shingles Vaccine? yes  Screening Tests Health Maintenance  Topic Date Due   COLON CANCER SCREENING ANNUAL FOBT  10/21/2018   INFLUENZA VACCINE  07/14/2019   COLONOSCOPY  10/21/2022   TETANUS/TDAP  04/07/2023   DEXA SCAN  Completed   Hepatitis C Screening  Completed   PNA vac Low Risk Adult  Completed    Cancer Screenings: Lung: Low Dose CT Chest recommended if Age 68-80 years, 30 pack-year currently smoking OR have quit w/in 15years. Patient does not qualify. Breast:  Up to date on Mammogram? No, scheduled for 11/02/2019   Up to date of Bone Density/Dexa? Yes, completed 08/09/2018 Colorectal: completed 10/21/2017  Additional Screenings:  Hepatitis C Screening: 08/09/2017     Plan:    Patient will walk and ride her exercise bike at least 6 miles a day.    I have personally reviewed and noted the following in the patients chart:    Medical and social history  Use of alcohol, tobacco or illicit drugs   Current medications and supplements  Functional ability and status  Nutritional status  Physical activity  Advanced directives  List of other physicians  Hospitalizations, surgeries, and ER visits in previous 12 months  Vitals  Screenings to include cognitive, depression, and falls  Referrals and appointments  In addition, I have reviewed and discussed with patient certain preventive protocols, quality metrics, and best practice recommendations. A written personalized care plan for preventive services as well as general preventive health recommendations were provided to patient.     Andrez Grime,  LPN  [id number]

## 2019-10-29 NOTE — Progress Notes (Addendum)
PCP notes:  Health Maintenance: Will check with insurance about Shingrix mammogram scheduled for 11/02/2019 Needs flu vaccine  Abnormal Screenings: none   Patient concerns: none   Nurse concerns: none   Next PCP appt.: 11/02/2019 @ 11:30 am   I reviewed health advisor's note, was available for consultation, and agree with documentation and plan. Loura Pardon MD

## 2019-11-02 ENCOUNTER — Ambulatory Visit (INDEPENDENT_AMBULATORY_CARE_PROVIDER_SITE_OTHER): Payer: Medicare Other | Admitting: Family Medicine

## 2019-11-02 ENCOUNTER — Other Ambulatory Visit: Payer: Self-pay

## 2019-11-02 ENCOUNTER — Encounter: Payer: Self-pay | Admitting: Family Medicine

## 2019-11-02 VITALS — BP 125/70 | HR 75 | Temp 97.1°F | Ht 63.75 in | Wt 130.4 lb

## 2019-11-02 DIAGNOSIS — Z0000 Encounter for general adult medical examination without abnormal findings: Secondary | ICD-10-CM | POA: Diagnosis not present

## 2019-11-02 DIAGNOSIS — E7800 Pure hypercholesterolemia, unspecified: Secondary | ICD-10-CM | POA: Diagnosis not present

## 2019-11-02 DIAGNOSIS — E559 Vitamin D deficiency, unspecified: Secondary | ICD-10-CM

## 2019-11-02 DIAGNOSIS — M810 Age-related osteoporosis without current pathological fracture: Secondary | ICD-10-CM | POA: Diagnosis not present

## 2019-11-02 DIAGNOSIS — Z23 Encounter for immunization: Secondary | ICD-10-CM | POA: Diagnosis not present

## 2019-11-02 MED ORDER — SIMVASTATIN 20 MG PO TABS: ORAL_TABLET | ORAL | 0 refills | Status: DC

## 2019-11-02 MED ORDER — DENOSUMAB 60 MG/ML ~~LOC~~ SOSY
60.0000 mg | PREFILLED_SYRINGE | Freq: Once | SUBCUTANEOUS | Status: AC
  Administered 2019-11-02: 60 mg via SUBCUTANEOUS

## 2019-11-02 MED ORDER — MELOXICAM 7.5 MG PO TABS: ORAL_TABLET | ORAL | 0 refills | Status: DC

## 2019-11-02 NOTE — Patient Instructions (Addendum)
If you are interested in the new shingles vaccine (Shingrix) - call your local pharmacy to check on coverage and availability  If affordable, get on a wait list at your pharmacy to get the vaccine.  Flu shot given today  Stay as active as you can safely be   You can take meloxicam as needed with food

## 2019-11-02 NOTE — Progress Notes (Signed)
Subjective:    Patient ID: Kylie Acosta, female    DOB: 05/13/1946, 73 y.o.   MRN: [id number]  HPI Here for health maintenance exam and to review chronic medical problems    Has a new hip since last visit  Did well overall  She is back to walking her dog and using the exercise bike    Wanted to know if she could take meloxicam prn Still has pain occasionally  Takes one as needed  Not bothering her stomach or anything else     Had amw 11/16 Noted she needs flu vaccine  Was going to check on coverage of shingrix vaccine  Has mammogram this am this am   No breast lumps on self exam   Colon screening- had colonoscopy 11/18 with 5 y recall (if she wants)  dexa 8/19  Osteoporosis  prolia -is due to get it  / now that her hip surgery is healed  Falls - one (chair rolled out from under her) - did not hurt herself  Fractures- none Supplements - taking D  Exercise - some walking and riding exercise bike  Vit D level is 53.8- good    Flu shot -given today  Wt Readings from Last 3 Encounters:  11/02/19 130 lb 7 oz (59.2 kg)  06/29/19 135 lb 5 oz (61.4 kg)  02/19/19 137 lb 9 oz (62.4 kg)  down 5 lb- has been able to be more active  22.57 kg/m   BP Readings from Last 3 Encounters:  11/02/19 (!) 142/78  06/29/19 138/90  02/19/19 134/90  bp was better on 2nd check BP: 125/70   Pulse Readings from Last 3 Encounters:  11/02/19 75  06/29/19 75  02/19/19 73   Hyperlipidemia Lab Results  Component Value Date   CHOL 173 10/29/2019   CHOL 168 08/18/2018   CHOL 171 08/09/2017   Lab Results  Component Value Date   HDL 64.80 10/29/2019   HDL 73.60 08/18/2018   HDL 67.10 08/09/2017   Lab Results  Component Value Date   LDLCALC 87 10/29/2019   LDLCALC 78 08/18/2018   LDLCALC 93 08/09/2017   Lab Results  Component Value Date   TRIG 106.0 10/29/2019   TRIG 79.0 08/18/2018   TRIG 57.0 08/09/2017   Lab Results  Component Value Date   CHOLHDL 3 10/29/2019   CHOLHDL 2 08/18/2018   CHOLHDL 3 08/09/2017   Lab Results  Component Value Date   LDLDIRECT 151.5 09/01/2010   Simvastatin and good diet  Eats a healthy diet   Other labs  Results for orders placed or performed in visit on 10/29/19  TSH  Result Value Ref Range   TSH 2.84 0.35 - 4.50 uIU/mL  Vitamin D (25 hydroxy)  Result Value Ref Range   VITD 53.85 30.00 - 100.00 ng/mL  Lipid panel  Result Value Ref Range   Cholesterol 173 0 - 200 mg/dL   Triglycerides 106.0 0.0 - 149.0 mg/dL   HDL 64.80 >39.00 mg/dL   VLDL 21.2 0.0 - 40.0 mg/dL   LDL Cholesterol 87 0 - 99 mg/dL   Total CHOL/HDL Ratio 3    NonHDL 108.16   Comprehensive metabolic panel  Result Value Ref Range   Sodium 137 135 - 145 mEq/L   Potassium 4.3 3.5 - 5.1 mEq/L   Chloride 101 96 - 112 mEq/L   CO2 29 19 - 32 mEq/L   Glucose, Bld 99 70 - 99 mg/dL   BUN 12 6 - 23  mg/dL   Creatinine, Ser 0.72 0.40 - 1.20 mg/dL   Total Bilirubin 0.7 0.2 - 1.2 mg/dL   Alkaline Phosphatase 42 39 - 117 U/L   AST 21 0 - 37 U/L   ALT 15 0 - 35 U/L   Total Protein 7.0 6.0 - 8.3 g/dL   Albumin 4.5 3.5 - 5.2 g/dL   GFR 78.83 >60.00 mL/min   Calcium 9.1 8.4 - 10.5 mg/dL  CBC with Differential  Result Value Ref Range   WBC 5.7 4.0 - 10.5 K/uL   RBC 4.21 3.87 - 5.11 Mil/uL   Hemoglobin 13.3 12.0 - 15.0 g/dL   HCT 39.8 36.0 - 46.0 %   MCV 94.4 78.0 - 100.0 fl   MCHC 33.5 30.0 - 36.0 g/dL   RDW 12.3 11.5 - 15.5 %   Platelets 279.0 150.0 - 400.0 K/uL   Neutrophils Relative % 61.7 43.0 - 77.0 %   Lymphocytes Relative 27.8 12.0 - 46.0 %   Monocytes Relative 7.4 3.0 - 12.0 %   Eosinophils Relative 2.4 0.0 - 5.0 %   Basophils Relative 0.7 0.0 - 3.0 %   Neutro Abs 3.5 1.4 - 7.7 K/uL   Lymphs Abs 1.6 0.7 - 4.0 K/uL   Monocytes Absolute 0.4 0.1 - 1.0 K/uL   Eosinophils Absolute 0.1 0.0 - 0.7 K/uL   Basophils Absolute 0.0 0.0 - 0.1 K/uL     Patient Active Problem List   Diagnosis Date Noted  . Stress reaction 06/29/2019  . Estrogen  deficiency 07/24/2018  . Hx of adenomatous colonic polyps 10/26/2017  . Routine general medical examination at a health care facility 07/30/2015  . Vitamin D deficiency 07/30/2015  . Encounter for Medicare annual wellness exam 04/06/2013  . Colon cancer screening 04/06/2013  . GERD 07/07/2010  . VAGINITIS, ATROPHIC 10/15/2009  . Osteoporosis 07/19/2007  . Hyperlipidemia 07/14/2007  . ALLERGIC RHINITIS 07/14/2007  . Eczema of hand 07/14/2007   Past Medical History:  Diagnosis Date  . Allergy    allerlgic rhinitis  . Hyperlipidemia   . Osteopenia   . Osteoporosis    Past Surgical History:  Procedure Laterality Date  . ABDOMINAL HYSTERECTOMY  1997   fibroids  . COLONOSCOPY     [place]/2005  . HIP SURGERY  08/14/2019   Social History   Tobacco Use  . Smoking status: Never Smoker  . Smokeless tobacco: Never Used  Substance Use Topics  . Alcohol use: No    Alcohol/week: 0.0 standard drinks  . Drug use: No   Family History  Problem Relation Age of Onset  . Colon cancer Paternal Aunt   . Colon polyps Neg Hx   . Esophageal cancer Neg Hx   . Rectal cancer Neg Hx   . Stomach cancer Neg Hx    Allergies  Allergen Reactions  . Benadryl [Diphenhydramine] Itching    Severe itching. Allergic reaction to cream.  . Alendronate Sodium     REACTION: gerd  . Atorvastatin     REACTION: muscle and joint pain  . Famotidine     REACTION: not effective  . Omeprazole     REACTION: not effective  . Raloxifene     REACTION: hot flashes  . Sulfonamide Derivatives     REACTION: whelps   Current Outpatient Medications on File Prior to Visit  Medication Sig Dispense Refill  . Calcium Carbonate-Vit D-Min (CALCIUM 1200 PO) Take 1 capsule by mouth daily.    . cholecalciferol (VITAMIN D) 1000 UNITS tablet Take  2,000 Units by mouth daily.     Marland Kitchen denosumab (PROLIA) 60 MG/ML SOSY injection Inject 60 mg into the skin every 6 (six) months.    . desoximetasone (TOPICORT) 0.25 % cream Apply 1  application topically 2 (two) times daily. To eczema on hands 30 g 3  . fluticasone (FLONASE) 50 MCG/ACT nasal spray fluticasone propionate 50 mcg/actuation nasal spray,suspension    . Multiple Vitamin (MULTIVITAMIN) capsule Take 1 capsule by mouth daily.     No current facility-administered medications on file prior to visit.     Review of Systems  Constitutional: Negative for activity change, appetite change, fatigue, fever and unexpected weight change.  HENT: Negative for congestion, ear pain, rhinorrhea, sinus pressure and sore throat.   Eyes: Negative for pain, redness and visual disturbance.  Respiratory: Negative for cough, shortness of breath and wheezing.   Cardiovascular: Negative for chest pain and palpitations.  Gastrointestinal: Negative for abdominal pain, blood in stool, constipation and diarrhea.  Endocrine: Negative for polydipsia and polyuria.  Genitourinary: Negative for dysuria, frequency and urgency.  Musculoskeletal: Negative for arthralgias, back pain and myalgias.  Skin: Negative for pallor and rash.  Allergic/Immunologic: Negative for environmental allergies.  Neurological: Negative for dizziness, syncope and headaches.  Hematological: Negative for adenopathy. Does not bruise/bleed easily.  Psychiatric/Behavioral: Negative for decreased concentration and dysphoric mood. The patient is not nervous/anxious.        Objective:   Physical Exam Constitutional:      General: She is not in acute distress.    Appearance: Normal appearance. She is well-developed and normal weight. She is not ill-appearing or diaphoretic.  HENT:     Head: Normocephalic and atraumatic.     Right Ear: Tympanic membrane, ear canal and external ear normal.     Left Ear: Tympanic membrane, ear canal and external ear normal.     Nose: Nose normal. No congestion.     Mouth/Throat:     Mouth: Mucous membranes are moist.     Pharynx: Oropharynx is clear. No posterior oropharyngeal erythema.   Eyes:     General: No scleral icterus.    Extraocular Movements: Extraocular movements intact.     Conjunctiva/sclera: Conjunctivae normal.     Pupils: Pupils are equal, round, and reactive to light.  Neck:     Musculoskeletal: Normal range of motion and neck supple. No neck rigidity or muscular tenderness.     Thyroid: No thyromegaly.     Vascular: No carotid bruit or JVD.  Cardiovascular:     Rate and Rhythm: Normal rate and regular rhythm.     Pulses: Normal pulses.     Heart sounds: Normal heart sounds. No gallop.   Pulmonary:     Effort: Pulmonary effort is normal. No respiratory distress.     Breath sounds: Normal breath sounds. No wheezing.     Comments: Good air exch Chest:     Chest wall: No tenderness.  Abdominal:     General: Bowel sounds are normal. There is no distension or abdominal bruit.     Palpations: Abdomen is soft. There is no mass.     Tenderness: There is no abdominal tenderness.     Hernia: No hernia is present.  Genitourinary:    Comments: Breast exam: No mass, nodules, thickening, tenderness, bulging, retraction, inflamation, nipple discharge or skin changes noted.  No axillary or clavicular LA.     Musculoskeletal: Normal range of motion.        General: No tenderness.  Right lower leg: No edema.     Left lower leg: No edema.     Comments: No kyphosis   Lymphadenopathy:     Cervical: No cervical adenopathy.  Skin:    General: Skin is warm and dry.     Coloration: Skin is not pale.     Findings: No erythema or rash.     Comments: Solar lentigines diffusely   Neurological:     Mental Status: She is alert. Mental status is at baseline.     Cranial Nerves: No cranial nerve deficit.     Motor: No abnormal muscle tone.     Coordination: Coordination normal.     Gait: Gait normal.     Deep Tendon Reflexes: Reflexes are normal and symmetric.  Psychiatric:        Mood and Affect: Mood normal.        Cognition and Memory: Cognition and memory  normal.           Assessment & Plan:   Problem List Items Addressed This Visit      Musculoskeletal and Integument   Osteoporosis    dexa 8/19  Getting Prolia- injection due/done today  One fall/no fx  Disc fall prevention  Taking D/ nl level  Walk/bike for exercise        Other   Hyperlipidemia    Disc goals for lipids and reasons to control them Rev last labs with pt Rev low sat fat diet in detail In good control with simvastatin and healthy diet       Relevant Medications   simvastatin (ZOCOR) 20 MG tablet   Routine general medical examination at a health care facility - Primary    Reviewed health habits including diet and exercise and skin cancer prevention Reviewed appropriate screening tests for age  Also reviewed health mt list, fam hx and immunization status , as well as social and family history   See HPI Labs reviewed  amw reviewed  Flu shot given today  Pt plans to check on coverage of the shingrix vaccine  Mammogram done today-will wait on results  utd colonoscopy  Enc healthy lifestyle - she has good health habits       Vitamin D deficiency    Vitamin D level is therapeutic with current supplementation Disc importance of this to bone and overall health  Level of 53.8 in setting of OP       Other Visit Diagnoses    Need for influenza vaccination       Relevant Orders   Flu Vaccine QUAD High Dose(Fluad) (Completed)

## 2019-11-02 NOTE — Assessment & Plan Note (Signed)
Disc goals for lipids and reasons to control them Rev last labs with pt Rev low sat fat diet in detail In good control with simvastatin and healthy diet

## 2019-11-02 NOTE — Assessment & Plan Note (Signed)
Vitamin D level is therapeutic with current supplementation Disc importance of this to bone and overall health  Level of 53.8 in setting of OP

## 2019-11-02 NOTE — Assessment & Plan Note (Signed)
Reviewed health habits including diet and exercise and skin cancer prevention Reviewed appropriate screening tests for age  Also reviewed health mt list, fam hx and immunization status , as well as social and family history   See HPI Labs reviewed  amw reviewed  Flu shot given today  Pt plans to check on coverage of the shingrix vaccine  Mammogram done today-will wait on results  utd colonoscopy  Enc healthy lifestyle - she has good health habits

## 2019-11-04 NOTE — Assessment & Plan Note (Signed)
dexa 8/19  Getting Prolia- injection due/done today  One fall/no fx  Disc fall prevention  Taking D/ nl level  Walk/bike for exercise

## 2019-11-14 ENCOUNTER — Encounter: Payer: Self-pay | Admitting: Family Medicine

## 2019-12-19 DIAGNOSIS — H25013 Cortical age-related cataract, bilateral: Secondary | ICD-10-CM | POA: Diagnosis not present

## 2019-12-19 DIAGNOSIS — H35033 Hypertensive retinopathy, bilateral: Secondary | ICD-10-CM | POA: Diagnosis not present

## 2019-12-19 DIAGNOSIS — H2513 Age-related nuclear cataract, bilateral: Secondary | ICD-10-CM | POA: Diagnosis not present

## 2019-12-19 DIAGNOSIS — H2512 Age-related nuclear cataract, left eye: Secondary | ICD-10-CM | POA: Diagnosis not present

## 2019-12-19 DIAGNOSIS — H25043 Posterior subcapsular polar age-related cataract, bilateral: Secondary | ICD-10-CM | POA: Diagnosis not present

## 2020-01-10 DIAGNOSIS — D485 Neoplasm of uncertain behavior of skin: Secondary | ICD-10-CM | POA: Diagnosis not present

## 2020-01-10 DIAGNOSIS — L820 Inflamed seborrheic keratosis: Secondary | ICD-10-CM | POA: Diagnosis not present

## 2020-02-04 ENCOUNTER — Other Ambulatory Visit: Payer: Self-pay | Admitting: *Deleted

## 2020-02-04 MED ORDER — MELOXICAM 7.5 MG PO TABS: ORAL_TABLET | ORAL | 1 refills | Status: DC

## 2020-02-04 MED ORDER — SIMVASTATIN 20 MG PO TABS: ORAL_TABLET | ORAL | 2 refills | Status: DC

## 2020-02-04 NOTE — Telephone Encounter (Signed)
CPE was on 11/02/19, last filled on 11/02/19 #90 tab with 0 refills, please advise  [facility] on file

## 2020-02-26 DIAGNOSIS — H25812 Combined forms of age-related cataract, left eye: Secondary | ICD-10-CM | POA: Diagnosis not present

## 2020-02-26 DIAGNOSIS — H2512 Age-related nuclear cataract, left eye: Secondary | ICD-10-CM | POA: Diagnosis not present

## 2020-03-04 DIAGNOSIS — H2512 Age-related nuclear cataract, left eye: Secondary | ICD-10-CM | POA: Diagnosis not present

## 2020-03-25 DIAGNOSIS — H25011 Cortical age-related cataract, right eye: Secondary | ICD-10-CM | POA: Diagnosis not present

## 2020-03-25 DIAGNOSIS — H25041 Posterior subcapsular polar age-related cataract, right eye: Secondary | ICD-10-CM | POA: Diagnosis not present

## 2020-03-25 DIAGNOSIS — H25811 Combined forms of age-related cataract, right eye: Secondary | ICD-10-CM | POA: Diagnosis not present

## 2020-03-25 DIAGNOSIS — H2511 Age-related nuclear cataract, right eye: Secondary | ICD-10-CM | POA: Diagnosis not present

## 2020-04-02 DIAGNOSIS — H2511 Age-related nuclear cataract, right eye: Secondary | ICD-10-CM | POA: Diagnosis not present

## 2020-04-08 ENCOUNTER — Telehealth: Payer: Self-pay

## 2020-04-08 NOTE — Telephone Encounter (Signed)
Pt has [employer].  Whether pt gets Rx Prolia from [facility] or from our office, pt would be responsible for $0 Prolia and admin fee (approx $20).  I have obtained medical PA if pt wants to do buy and bill from our office.  [id number], valid 4/21 thru 12/12/20.  If pt wants obtain Prolia thru specialty pharmacy, please advise and I will obtain auth.

## 2020-04-11 NOTE — Telephone Encounter (Signed)
LVM

## 2020-04-15 NOTE — Telephone Encounter (Signed)
Contacted pt and scheduled for lab on 5/7.

## 2020-04-15 NOTE — Addendum Note (Signed)
Addended by: Randall An on: 04/15/2020 05:15 PM   Modules accepted: Orders

## 2020-04-15 NOTE — Telephone Encounter (Signed)
That is fine  thanks

## 2020-04-16 ENCOUNTER — Telehealth: Payer: Self-pay | Admitting: Radiology

## 2020-04-16 NOTE — Telephone Encounter (Signed)
Opened in error

## 2020-04-17 ENCOUNTER — Other Ambulatory Visit: Payer: Self-pay | Admitting: Family Medicine

## 2020-04-17 ENCOUNTER — Telehealth: Payer: Self-pay | Admitting: Family Medicine

## 2020-04-17 DIAGNOSIS — M810 Age-related osteoporosis without current pathological fracture: Secondary | ICD-10-CM

## 2020-04-17 NOTE — Telephone Encounter (Signed)
I think this is for a Prolia shot  She does need a calcium   I think Shannon at one point tried to order/that may be the issue Please add back the ca

## 2020-04-18 ENCOUNTER — Other Ambulatory Visit (INDEPENDENT_AMBULATORY_CARE_PROVIDER_SITE_OTHER): Payer: Medicare PPO

## 2020-04-18 ENCOUNTER — Other Ambulatory Visit: Payer: Self-pay

## 2020-04-18 DIAGNOSIS — M810 Age-related osteoporosis without current pathological fracture: Secondary | ICD-10-CM | POA: Diagnosis not present

## 2020-04-18 LAB — BASIC METABOLIC PANEL
BUN: 17 mg/dL (ref 6–23)
CO2: 34 mEq/L — ABNORMAL HIGH (ref 19–32)
Calcium: 9.4 mg/dL (ref 8.4–10.5)
Chloride: 100 mEq/L (ref 96–112)
Creatinine, Ser: 0.76 mg/dL (ref 0.40–1.20)
GFR: 73.97 mL/min (ref >60.00)
Glucose, Bld: 91 mg/dL (ref 70–99)
Potassium: 3.8 mEq/L (ref 3.5–5.1)
Sodium: 137 mEq/L (ref 135–145)

## 2020-04-21 NOTE — Telephone Encounter (Signed)
All labs normal. Ca is 9.4 and CrCl is 58.87mL/min. Scheduled pt for prolia inj on 5/25. Pt verbalized understanding.

## 2020-04-28 ENCOUNTER — Telehealth: Payer: Self-pay | Admitting: Family Medicine

## 2020-04-28 DIAGNOSIS — Z0000 Encounter for general adult medical examination without abnormal findings: Secondary | ICD-10-CM

## 2020-04-28 DIAGNOSIS — E7800 Pure hypercholesterolemia, unspecified: Secondary | ICD-10-CM

## 2020-04-28 DIAGNOSIS — E559 Vitamin D deficiency, unspecified: Secondary | ICD-10-CM

## 2020-04-28 DIAGNOSIS — M810 Age-related osteoporosis without current pathological fracture: Secondary | ICD-10-CM

## 2020-04-28 NOTE — Telephone Encounter (Signed)
-----   Message from Ria Bush, MD sent at 04/25/2020 12:48 PM EDT ----- Regarding: FW: Lab orders for Wednesday, 5.19.21 To PCP ----- Message ----- From: Ellamae Sia Sent: 04/17/2020   9:50 AM EDT To: Ria Bush, MD Subject: Lab orders for Wednesday, 5.19.21              Patient is scheduled for CPX labs, please order future labs, Thanks , Karna Christmas

## 2020-05-05 DIAGNOSIS — L538 Other specified erythematous conditions: Secondary | ICD-10-CM | POA: Diagnosis not present

## 2020-05-05 DIAGNOSIS — L820 Inflamed seborrheic keratosis: Secondary | ICD-10-CM | POA: Diagnosis not present

## 2020-05-06 ENCOUNTER — Ambulatory Visit (INDEPENDENT_AMBULATORY_CARE_PROVIDER_SITE_OTHER): Payer: Medicare PPO

## 2020-05-06 ENCOUNTER — Other Ambulatory Visit: Payer: Self-pay

## 2020-05-06 DIAGNOSIS — M810 Age-related osteoporosis without current pathological fracture: Secondary | ICD-10-CM | POA: Diagnosis not present

## 2020-05-06 MED ORDER — DENOSUMAB 60 MG/ML ~~LOC~~ SOSY
60.0000 mg | PREFILLED_SYRINGE | Freq: Once | SUBCUTANEOUS | Status: AC
  Administered 2020-05-06: 60 mg via SUBCUTANEOUS

## 2020-05-06 NOTE — Progress Notes (Signed)
Patient was given Prolia injection per orders from Dr. Glori Bickers. Injection was given in right arm. Patient tolerated well.

## 2020-06-20 ENCOUNTER — Other Ambulatory Visit: Payer: Self-pay | Admitting: Family Medicine

## 2020-09-01 ENCOUNTER — Telehealth: Payer: Self-pay

## 2020-09-01 NOTE — Telephone Encounter (Signed)
Auth received on 04/03/2020  Effective until 12-12-2020.  Next injection due 11/06/2020 Will need bmp

## 2020-09-16 ENCOUNTER — Other Ambulatory Visit: Payer: Self-pay

## 2020-09-16 DIAGNOSIS — M810 Age-related osteoporosis without current pathological fracture: Secondary | ICD-10-CM

## 2020-09-16 NOTE — Telephone Encounter (Signed)
Called patient needs to make lab and prolia appointment after 11/06/2020.  Lab order placed as future.

## 2020-09-26 DIAGNOSIS — D2272 Melanocytic nevi of left lower limb, including hip: Secondary | ICD-10-CM | POA: Diagnosis not present

## 2020-09-26 DIAGNOSIS — D2262 Melanocytic nevi of left upper limb, including shoulder: Secondary | ICD-10-CM | POA: Diagnosis not present

## 2020-09-26 DIAGNOSIS — D225 Melanocytic nevi of trunk: Secondary | ICD-10-CM | POA: Diagnosis not present

## 2020-09-26 DIAGNOSIS — D2271 Melanocytic nevi of right lower limb, including hip: Secondary | ICD-10-CM | POA: Diagnosis not present

## 2020-09-26 DIAGNOSIS — D2261 Melanocytic nevi of right upper limb, including shoulder: Secondary | ICD-10-CM | POA: Diagnosis not present

## 2020-09-26 DIAGNOSIS — L821 Other seborrheic keratosis: Secondary | ICD-10-CM | POA: Diagnosis not present

## 2020-10-03 ENCOUNTER — Other Ambulatory Visit: Payer: Self-pay | Admitting: Family Medicine

## 2020-10-03 NOTE — Telephone Encounter (Signed)
Last CPE was on 11/02/19 and no future appts., please advise

## 2020-10-03 NOTE — Telephone Encounter (Signed)
Please schedule PE and refill until then  

## 2020-10-06 DIAGNOSIS — H0014 Chalazion left upper eyelid: Secondary | ICD-10-CM | POA: Diagnosis not present

## 2020-10-06 NOTE — Telephone Encounter (Signed)
I left a detailed message on patient's voice mail for her to call back and schedule cpx.  Patient's last cpx was on 11/02/19.

## 2020-10-06 NOTE — Telephone Encounter (Signed)
Med refilled once and [facility] will reach out to pt to try and get CPE scheduled

## 2020-10-07 ENCOUNTER — Telehealth: Payer: Self-pay | Admitting: Family Medicine

## 2020-10-07 NOTE — Telephone Encounter (Signed)
They will be part of her regular CPE labs

## 2020-10-07 NOTE — Telephone Encounter (Signed)
Pt has cpx labs on 11/29 and wants to know if labs for her prolia injection can be added

## 2020-10-08 NOTE — Telephone Encounter (Signed)
Patient notified

## 2020-10-09 NOTE — Telephone Encounter (Signed)
LVM. Pt will need labs ordered and scheduled for prolia inj after 11/06/20.

## 2020-10-10 NOTE — Telephone Encounter (Signed)
Contacted pt and advised $0 prolia cost and $20 admin cost. Pt agreed . Pt has pre-cpe labs on 11/29 and cpe with PCP on 12/2. Advised this nurse would f/u to assure labs were normal for prolia and if normal she could receive prolia injection on 12/2 at cpe apt. Pt appreciative and verbalized understanding.

## 2020-11-02 ENCOUNTER — Other Ambulatory Visit: Payer: Self-pay | Admitting: Family Medicine

## 2020-11-10 ENCOUNTER — Other Ambulatory Visit (INDEPENDENT_AMBULATORY_CARE_PROVIDER_SITE_OTHER): Payer: Medicare PPO

## 2020-11-10 ENCOUNTER — Other Ambulatory Visit: Payer: Self-pay

## 2020-11-10 DIAGNOSIS — E7800 Pure hypercholesterolemia, unspecified: Secondary | ICD-10-CM | POA: Diagnosis not present

## 2020-11-10 DIAGNOSIS — E559 Vitamin D deficiency, unspecified: Secondary | ICD-10-CM

## 2020-11-10 DIAGNOSIS — Z0000 Encounter for general adult medical examination without abnormal findings: Secondary | ICD-10-CM

## 2020-11-10 LAB — COMPREHENSIVE METABOLIC PANEL
ALT: 16 U/L (ref 0–35)
AST: 19 U/L (ref 0–37)
Albumin: 4.3 g/dL (ref 3.5–5.2)
Alkaline Phosphatase: 37 U/L — ABNORMAL LOW (ref 39–117)
BUN: 14 mg/dL (ref 6–23)
CO2: 30 mEq/L (ref 19–32)
Calcium: 8.8 mg/dL (ref 8.4–10.5)
Chloride: 102 mEq/L (ref 96–112)
Creatinine, Ser: 0.71 mg/dL (ref 0.40–1.20)
GFR: 82.45 mL/min (ref >60.00)
Glucose, Bld: 87 mg/dL (ref 70–99)
Potassium: 4 mEq/L (ref 3.5–5.1)
Sodium: 139 mEq/L (ref 135–145)
Total Bilirubin: 0.9 mg/dL (ref 0.2–1.2)
Total Protein: 6.4 g/dL (ref 6.0–8.3)

## 2020-11-10 LAB — LIPID PANEL
Cholesterol: 165 mg/dL (ref 0–200)
HDL: 74 mg/dL (ref >39.00)
LDL Cholesterol: 79 mg/dL (ref 0–99)
NonHDL: 91.16
Total CHOL/HDL Ratio: 2
Triglycerides: 61 mg/dL (ref 0.0–149.0)
VLDL: 12.2 mg/dL (ref 0.0–40.0)

## 2020-11-10 LAB — TSH: TSH: 3.45 u[IU]/mL (ref 0.35–4.50)

## 2020-11-10 LAB — CBC WITH DIFFERENTIAL/PLATELET
Basophils Absolute: 0.1 10*3/uL (ref 0.0–0.1)
Basophils Relative: 1 % (ref 0.0–3.0)
Eosinophils Absolute: 0.1 10*3/uL (ref 0.0–0.7)
Eosinophils Relative: 2.9 % (ref 0.0–5.0)
HCT: 38.5 % (ref 36.0–46.0)
Hemoglobin: 13.3 g/dL (ref 12.0–15.0)
Lymphocytes Relative: 25.8 % (ref 12.0–46.0)
Lymphs Abs: 1.3 10*3/uL (ref 0.7–4.0)
MCHC: 34.5 g/dL (ref 30.0–36.0)
MCV: 94.8 fl (ref 78.0–100.0)
Monocytes Absolute: 0.4 10*3/uL (ref 0.1–1.0)
Monocytes Relative: 7.7 % (ref 3.0–12.0)
Neutro Abs: 3.1 10*3/uL (ref 1.4–7.7)
Neutrophils Relative %: 62.6 % (ref 43.0–77.0)
Platelets: 265 10*3/uL (ref 150.0–400.0)
RBC: 4.06 Mil/uL (ref 3.87–5.11)
RDW: 12.5 % (ref 11.5–15.5)
WBC: 5 10*3/uL (ref 4.0–10.5)

## 2020-11-10 LAB — VITAMIN D 25 HYDROXY (VIT D DEFICIENCY, FRACTURES): VITD: 75.78 ng/mL (ref 30.00–100.00)

## 2020-11-11 NOTE — Telephone Encounter (Signed)
Ca level normal and CrCl is normal at 72mL/min. Pt can receive prolia injection

## 2020-11-13 ENCOUNTER — Ambulatory Visit (INDEPENDENT_AMBULATORY_CARE_PROVIDER_SITE_OTHER): Payer: Medicare PPO | Admitting: Family Medicine

## 2020-11-13 ENCOUNTER — Encounter: Payer: Self-pay | Admitting: Family Medicine

## 2020-11-13 ENCOUNTER — Other Ambulatory Visit: Payer: Self-pay

## 2020-11-13 VITALS — BP 136/84 | HR 70 | Temp 97.0°F | Ht 64.25 in | Wt 126.4 lb

## 2020-11-13 DIAGNOSIS — E559 Vitamin D deficiency, unspecified: Secondary | ICD-10-CM | POA: Diagnosis not present

## 2020-11-13 DIAGNOSIS — M810 Age-related osteoporosis without current pathological fracture: Secondary | ICD-10-CM | POA: Diagnosis not present

## 2020-11-13 DIAGNOSIS — Z0000 Encounter for general adult medical examination without abnormal findings: Secondary | ICD-10-CM

## 2020-11-13 DIAGNOSIS — E2839 Other primary ovarian failure: Secondary | ICD-10-CM | POA: Diagnosis not present

## 2020-11-13 DIAGNOSIS — E7800 Pure hypercholesterolemia, unspecified: Secondary | ICD-10-CM | POA: Diagnosis not present

## 2020-11-13 DIAGNOSIS — M25551 Pain in right hip: Secondary | ICD-10-CM | POA: Diagnosis not present

## 2020-11-13 MED ORDER — DENOSUMAB 60 MG/ML ~~LOC~~ SOSY
60.0000 mg | PREFILLED_SYRINGE | Freq: Once | SUBCUTANEOUS | Status: AC
  Administered 2020-11-13: 60 mg via SUBCUTANEOUS

## 2020-11-13 MED ORDER — SIMVASTATIN 20 MG PO TABS
ORAL_TABLET | ORAL | 3 refills | Status: DC
Start: 2020-11-13 — End: 2020-12-22

## 2020-11-13 NOTE — Patient Instructions (Addendum)
I placed an orthopedic referral  The office will call you  Make sure to schedule your mammogram  Also bone density-I put the order in   prolia today   Then get flu shot in about a month  You can get a covid booster any time after a week   If you are interested in the new shingles vaccine (Shingrix) - call your local pharmacy to check on coverage and availability  If affordable, get on a wait list at your pharmacy to get the vaccine.

## 2020-11-13 NOTE — Assessment & Plan Note (Addendum)
Reviewed health habits including diet and exercise and skin cancer prevention Reviewed appropriate screening tests for age  Also reviewed health mt list, fam hx and immunization status , as well as social and family history   See HPI Labs reviewed  Pt plans to schedule mammogram after the holidays dexa ref give (one fall no fractures) disc fall prev D level is tx-will continue to take and exercise  Adv directive is up to date  No cognitive concerns Good hearing screen and utd eye/vision care  prolia shot today- will have to separate a month from flu shot  Planning covid booster

## 2020-11-13 NOTE — Assessment & Plan Note (Signed)
Level 75.7 Vitamin D level is therapeutic with current supplementation Disc importance of this to bone and overall health

## 2020-11-13 NOTE — Assessment & Plan Note (Signed)
dexa ordered  prolia shot today  One fall -disc fall prev No fractures  D level is tx  Good exercise

## 2020-11-13 NOTE — Assessment & Plan Note (Addendum)
Reviewed health habits including diet and exercise and skin cancer prevention Reviewed appropriate screening tests for age  Also reviewed health mt list, fam hx and immunization status , as well as social and family history   See HPI Labs reviewed  Pt plans to schedule mammogram after the holidays dexa ref give (one fall no fractures) disc fall prev D level is tx-will continue to take and exercise  Adv directive is up to date  No cognitive concerns Good hearing screen and utd eye/vision care  prolia shot today  Will have to separate from flu shot by a month Planning covid booster

## 2020-11-13 NOTE — Progress Notes (Signed)
Subjective:    Patient ID: Kylie Acosta, female    DOB: [birth year], 74 y.o.   MRN: [id number]  This visit occurred during the SARS-CoV-2 public health emergency.  Safety protocols were in place, including screening questions prior to the visit, additional usage of staff PPE, and extensive cleaning of exam room while observing appropriate contact time as indicated for disinfecting solutions.    HPI Pt presents for amw and annual health mt visit  I have personally reviewed the Medicare Annual Wellness questionnaire and have noted 1. The patient's medical and social history 2. Their use of alcohol, tobacco or illicit drugs 3. Their current medications and supplements 4. The patient's functional ability including ADL's, fall risks, home safety risks and hearing or visual             impairment. 5. Diet and physical activities 6. Evidence for depression or mood disorders  The patients weight, height, BMI have been recorded in the chart and visual acuity is per eye clinic.  I have made referrals, counseling and provided education to the patient based review of the above and I have provided the pt with a written personalized care plan for preventive services. Reviewed and updated provider list, see scanned forms.  See scanned forms.  Routine anticipatory guidance given to patient.  See health maintenance. Colon cancer screening  Colonoscopy 11/18  Recall 2023 for polyps (had a positive cologuard in 2018)  Breast cancer screening  Mammo neg 11/20  Self breast exam-no lumps  Flu vaccine-has to wait a month due to prolia shot today Tetanus vaccine   4/14 Td Pneumovax completed covid status  Immunized pfizer February -planning on a booster  Zoster vaccine  zostavax  Dexa 8/19   OP   Taking prolia  Falls- had a fall -tripped in closet without turning light on, no injury  Fractures-none  Supplements- D level is 75.7  Exercise -walking   Advance directive-has up to date  Cognitive  function addressed- see scanned forms- and if abnormal then additional documentation follows.   PMH and SH reviewed  Meds, vitals, and allergies reviewed.   ROS: See HPI.  Otherwise negative.    Weight : Wt Readings from Last 3 Encounters:  11/13/20 126 lb 7 oz (57.4 kg)  11/02/19 130 lb 7 oz (59.2 kg)  06/29/19 135 lb 5 oz (61.4 kg)   21.53 kg/m  Very active/busy    Hearing/vision:  Hearing Screening   125Hz  250Hz  500Hz  1000Hz  2000Hz  3000Hz  4000Hz  6000Hz  8000Hz   Right ear:   40 40 40  40    Left ear:   40 40 40  0    Vision Screening Comments: Eye exam on 10/06/20 Ward hearing is good    Care team  Johnda Billiot-pcp Kowalski-derm Karalee Height- GI  Doing well overall for her age  R hip repl over a year ago- is not quite right still  Pain is in lateral hip , has not gone back to the surgeon     Hyperlipidemia Lab Results  Component Value Date   CHOL 165 11/10/2020   CHOL 173 10/29/2019   CHOL 168 08/18/2018   Lab Results  Component Value Date   HDL 74.00 11/10/2020   HDL 64.80 10/29/2019   HDL 73.60 08/18/2018   Lab Results  Component Value Date   LDLCALC 79 11/10/2020   [place] 87 10/29/2019   [place] 78 08/18/2018   Lab Results  Component Value Date   TRIG 61.0 11/10/2020  TRIG 106.0 10/29/2019   TRIG 79.0 08/18/2018   Lab Results  Component Value Date   CHOLHDL 2 11/10/2020   CHOLHDL 3 10/29/2019   CHOLHDL 2 08/18/2018   Lab Results  Component Value Date   LDLDIRECT 151.5 09/01/2010  simvastatin and diet   Other labs Results for orders placed or performed in visit on 11/10/20  VITAMIN D 25 Hydroxy (Vit-D Deficiency, Fractures)  Result Value Ref Range   VITD 75.78 30.00 - 100.00 ng/mL  TSH  Result Value Ref Range   TSH 3.45 0.35 - 4.50 uIU/mL  Lipid panel  Result Value Ref Range   Cholesterol 165 0 - 200 mg/dL   Triglycerides 61.0 0 - 149 mg/dL   HDL 74.00 >39.00 mg/dL   VLDL 12.2 0.0 - 40.0 mg/dL   LDL  Cholesterol 79 0 - 99 mg/dL   Total CHOL/HDL Ratio 2    NonHDL 91.16   Comprehensive metabolic panel  Result Value Ref Range   Sodium 139 135 - 145 mEq/L   Potassium 4.0 3.5 - 5.1 mEq/L   Chloride 102 96 - 112 mEq/L   CO2 30 19 - 32 mEq/L   Glucose, Bld 87 70 - 99 mg/dL   BUN 14 6 - 23 mg/dL   Creatinine, Ser 0.71 0.40 - 1.20 mg/dL   Total Bilirubin 0.9 0.2 - 1.2 mg/dL   Alkaline Phosphatase 37 (L) 39 - 117 U/L   AST 19 0 - 37 U/L   ALT 16 0 - 35 U/L   Total Protein 6.4 6.0 - 8.3 g/dL   Albumin 4.3 3.5 - 5.2 g/dL   GFR 82.45 >60.00 mL/min   Calcium 8.8 8.4 - 10.5 mg/dL  CBC with Differential/Platelet  Result Value Ref Range   WBC 5.0 4.0 - 10.5 K/uL   RBC 4.06 3.87 - 5.11 Mil/uL   Hemoglobin 13.3 12.0 - 15.0 g/dL   HCT 38.5 36 - 46 %   MCV 94.8 78.0 - 100.0 fl   MCHC 34.5 30.0 - 36.0 g/dL   RDW 12.5 11.5 - 15.5 %   Platelets 265.0 150 - 400 K/uL   Neutrophils Relative % 62.6 43 - 77 %   Lymphocytes Relative 25.8 12 - 46 %   Monocytes Relative 7.7 3 - 12 %   Eosinophils Relative 2.9 0 - 5 %   Basophils Relative 1.0 0 - 3 %   Neutro Abs 3.1 1.4 - 7.7 K/uL   Lymphs Abs 1.3 0.7 - 4.0 K/uL   Monocytes Absolute 0.4 0.1 - 1.0 K/uL   Eosinophils Absolute 0.1 0.0 - 0.7 K/uL   Basophils Absolute 0.1 0.0 - 0.1 K/uL     Patient Active Problem List   Diagnosis Date Noted  . Right hip pain 11/13/2020  . Stress reaction 06/29/2019  . Estrogen deficiency 07/24/2018  . Hx of adenomatous colonic polyps 10/26/2017  . Routine general medical examination at a health care facility 07/30/2015  . Vitamin D deficiency 07/30/2015  . Encounter for Medicare annual wellness exam 04/06/2013  . Colon cancer screening 04/06/2013  . GERD 07/07/2010  . VAGINITIS, ATROPHIC 10/15/2009  . Osteoporosis 07/19/2007  . Hyperlipidemia 07/14/2007  . ALLERGIC RHINITIS 07/14/2007  . Eczema of hand 07/14/2007   Past Medical History:  Diagnosis Date  . Allergy    allerlgic rhinitis  . Hyperlipidemia    . Osteopenia   . Osteoporosis    Past Surgical History:  Procedure Laterality Date  . ABDOMINAL HYSTERECTOMY  1997   fibroids  .  COLONOSCOPY     [place]/2005  . HIP SURGERY  08/14/2019   Social History   Tobacco Use  . Smoking status: Never Smoker  . Smokeless tobacco: Never Used  Vaping Use  . Vaping Use: Never used  Substance Use Topics  . Alcohol use: No    Alcohol/week: 0.0 standard drinks  . Drug use: No   Family History  Problem Relation Age of Onset  . Colon cancer Paternal Aunt   . Colon polyps Neg Hx   . Esophageal cancer Neg Hx   . Rectal cancer Neg Hx   . Stomach cancer Neg Hx    Allergies  Allergen Reactions  . Benadryl [Diphenhydramine] Itching    Severe itching. Allergic reaction to cream.  . Alendronate Sodium     REACTION: gerd  . Atorvastatin     REACTION: muscle and joint pain  . Famotidine     REACTION: not effective  . Omeprazole     REACTION: not effective  . Raloxifene     REACTION: hot flashes  . Sulfonamide Derivatives     REACTION: whelps   Current Outpatient Medications on File Prior to Visit  Medication Sig Dispense Refill  . Calcium Carbonate-Vit D-Min (CALCIUM 1200 PO) Take 1 capsule by mouth daily.    . cholecalciferol (VITAMIN D) 1000 UNITS tablet Take 2,000 Units by mouth daily.     Marland Kitchen denosumab (PROLIA) 60 MG/ML SOSY injection Inject 60 mg into the skin every 6 (six) months.    . desoximetasone (TOPICORT) 0.25 % cream Apply 1 application topically 2 (two) times daily. To eczema on hands 30 g 3  . fluticasone (FLONASE) 50 MCG/ACT nasal spray SHAKE LIQUID AND USE 2 SPRAYS IN EACH NOSTRIL EVERY DAY 16 g 2  . meloxicam (MOBIC) 7.5 MG tablet TAKE 1 TABLET(7.5 MG) BY MOUTH DAILY WITH A MEAL AS NEEDED FOR PAIN 90 tablet 1  . Multiple Vitamin (MULTIVITAMIN) capsule Take 1 capsule by mouth daily.     No current facility-administered medications on file prior to visit.    Review of Systems  Constitutional: Negative for activity  change, appetite change, fatigue, fever and unexpected weight change.  HENT: Negative for congestion, ear pain, rhinorrhea, sinus pressure and sore throat.   Eyes: Negative for pain, redness and visual disturbance.  Respiratory: Negative for cough, shortness of breath and wheezing.   Cardiovascular: Negative for chest pain and palpitations.  Gastrointestinal: Negative for abdominal pain, blood in stool, constipation and diarrhea.  Endocrine: Negative for polydipsia and polyuria.  Genitourinary: Negative for dysuria, frequency and urgency.  Musculoskeletal: Negative for arthralgias, back pain and myalgias.       Pain in lateral R hip area   Skin: Negative for pallor and rash.  Allergic/Immunologic: Negative for environmental allergies.  Neurological: Negative for dizziness, syncope and headaches.  Hematological: Negative for adenopathy. Does not bruise/bleed easily.  Psychiatric/Behavioral: Negative for decreased concentration and dysphoric mood. The patient is not nervous/anxious.        Objective:   Physical Exam Constitutional:      General: She is not in acute distress.    Appearance: Normal appearance. She is well-developed and normal weight. She is not ill-appearing or diaphoretic.  HENT:     Head: Normocephalic and atraumatic.     Right Ear: Tympanic membrane, ear canal and external ear normal.     Left Ear: Tympanic membrane, ear canal and external ear normal.     Nose: Nose normal. No congestion.  Mouth/Throat:     Mouth: Mucous membranes are moist.     Pharynx: Oropharynx is clear. No posterior oropharyngeal erythema.  Eyes:     General: No scleral icterus.    Extraocular Movements: Extraocular movements intact.     Conjunctiva/sclera: Conjunctivae normal.     Pupils: Pupils are equal, round, and reactive to light.  Neck:     Thyroid: No thyromegaly.     Vascular: No carotid bruit or JVD.  Cardiovascular:     Rate and Rhythm: Normal rate and regular rhythm.      Pulses: Normal pulses.     Heart sounds: Normal heart sounds. No gallop.   Pulmonary:     Effort: Pulmonary effort is normal. No respiratory distress.     Breath sounds: Normal breath sounds. No wheezing.     Comments: Good air exch Chest:     Chest wall: No tenderness.  Abdominal:     General: Bowel sounds are normal. There is no distension or abdominal bruit.     Palpations: Abdomen is soft. There is no mass.     Tenderness: There is no abdominal tenderness.     Hernia: No hernia is present.  Genitourinary:    Comments: Breast exam: No mass, nodules, thickening, tenderness, bulging, retraction, inflamation, nipple discharge or skin changes noted.  No axillary or clavicular LA.     Musculoskeletal:        General: No tenderness. Normal range of motion.     Cervical back: Normal range of motion and neck supple. No rigidity. No muscular tenderness.     Right lower leg: No edema.     Left lower leg: No edema.     Comments: No kyphosis   Lymphadenopathy:     Cervical: No cervical adenopathy.  Skin:    General: Skin is warm and dry.     Coloration: Skin is not pale.     Findings: No erythema or rash.     Comments: Fair  Some lentigines   Neurological:     Mental Status: She is alert. Mental status is at baseline.     Cranial Nerves: No cranial nerve deficit.     Motor: No abnormal muscle tone.     Coordination: Coordination normal.     Gait: Gait normal.     Deep Tendon Reflexes: Reflexes are normal and symmetric.  Psychiatric:        Mood and Affect: Mood normal.        Cognition and Memory: Cognition and memory normal.           Assessment & Plan:   Problem List Items Addressed This Visit      Musculoskeletal and Integument   Osteoporosis    dexa ordered  prolia shot today  One fall -disc fall prev No fractures  D level is tx  Good exercise         Other   Hyperlipidemia   Relevant Medications   simvastatin (ZOCOR) 20 MG tablet   Encounter for Medicare  annual wellness exam - Primary    Reviewed health habits including diet and exercise and skin cancer prevention Reviewed appropriate screening tests for age  Also reviewed health mt list, fam hx and immunization status , as well as social and family history   See HPI Labs reviewed  Pt plans to schedule mammogram after the holidays dexa ref give (one fall no fractures) disc fall prev D level is tx-will continue to take and exercise  Adv directive  is up to date  No cognitive concerns Good hearing screen and utd eye/vision care  prolia shot today  Will have to separate from flu shot by a month Planning covid booster       Routine general medical examination at a health care facility    Reviewed health habits including diet and exercise and skin cancer prevention Reviewed appropriate screening tests for age  Also reviewed health mt list, fam hx and immunization status , as well as social and family history   See HPI Labs reviewed  Pt plans to schedule mammogram after the holidays dexa ref give (one fall no fractures) disc fall prev D level is tx-will continue to take and exercise  Adv directive is up to date  No cognitive concerns Good hearing screen and utd eye/vision care  prolia shot today- will have to separate a month from flu shot  Planning covid booster      Vitamin D deficiency    Level 75.7 Vitamin D level is therapeutic with current supplementation Disc importance of this to bone and overall health       Estrogen deficiency   Relevant Orders   DG Bone Density   Right hip pain    Pain is lateral-ever since her hip replacement  Unsure if related to that or gait  Wears rise in the other shoe Wants to go to a different practice for ortho-ref given      Relevant Orders   Ambulatory referral to Orthopedic Surgery

## 2020-11-13 NOTE — Assessment & Plan Note (Signed)
Pain is lateral-ever since her hip replacement  Unsure if related to that or gait  Wears rise in the other shoe Wants to go to a different practice for ortho-ref given

## 2020-11-21 ENCOUNTER — Telehealth: Payer: Self-pay

## 2020-11-21 NOTE — Telephone Encounter (Signed)
Left message for patient to call back. Need to know which location patient wants to go to for orthopedics referral. Received notice she does not want to go back to [facility]. See notes in the chart she went to [facility] before also. No other places in [place] for this specialty

## 2020-11-24 NOTE — Telephone Encounter (Signed)
Left message for patient to call back  

## 2020-11-24 NOTE — Telephone Encounter (Signed)
Spoke with patient and advised of all. Patient does not want to go to [facility] or [facility]. Patient will look online at [facility] orthopedic offices and will call us back with her decision on where she wants to go to.

## 2020-12-01 ENCOUNTER — Telehealth: Payer: Self-pay

## 2020-12-01 ENCOUNTER — Ambulatory Visit: Payer: Medicare PPO

## 2020-12-01 NOTE — Telephone Encounter (Signed)
Pt left v/m; pt said last visit pt got prolia shot and pt wants to know when can get the covid booster.  I looked at AVS from 11/13/20 booster. Unable to reach pt by phone but pt said could leave detailed v/m. Advised pt if she still had her paperwork from 11/13/20 visit; the after visit summary this info was detailed there. Per pt instruction and DPR left detailed v/m on pts phone with this info. Per AVS from 11/13/20 pt got prolia shot on 11/13/20 and could get the flu shot in about 1 month and pt could get the covid booster any time after one wk of getting prolia shot. Also left in v/m any further questions not to hesitate to call. FYI to Dr Glori Bickers.

## 2020-12-09 ENCOUNTER — Encounter: Payer: Self-pay | Admitting: Family Medicine

## 2020-12-09 DIAGNOSIS — Z1231 Encounter for screening mammogram for malignant neoplasm of breast: Secondary | ICD-10-CM | POA: Diagnosis not present

## 2020-12-22 ENCOUNTER — Other Ambulatory Visit: Payer: Self-pay | Admitting: Family Medicine

## 2020-12-22 ENCOUNTER — Ambulatory Visit: Payer: Medicare PPO | Attending: Internal Medicine

## 2020-12-22 DIAGNOSIS — Z23 Encounter for immunization: Secondary | ICD-10-CM

## 2020-12-22 NOTE — Progress Notes (Signed)
   Covid-19 Vaccination Clinic  Name:  Kylie Acosta    MRN: [id number] DOB: [birth year]  12/22/2020  [name] was observed post Covid-19 immunization for 15 minutes without incident. She was provided with Vaccine Information Sheet and instruction to access the V-Safe system.   [name] was instructed to call 911 with any severe reactions post vaccine: Marland Kitchen Difficulty breathing  . Swelling of face and throat  . A fast heartbeat  . A bad rash all over body  . Dizziness and weakness   Immunizations Administered    Name Date Dose VIS Date Route   Pfizer COVID-19 Vaccine 12/22/2020  1:20 PM 0.3 mL 10/01/2020 Intramuscular   Manufacturer: [place]   Lot: [id number]   [place]: [id number]

## 2021-01-05 ENCOUNTER — Telehealth: Payer: Self-pay

## 2021-01-05 DIAGNOSIS — E2839 Other primary ovarian failure: Secondary | ICD-10-CM

## 2021-01-05 NOTE — Telephone Encounter (Signed)
Patient is calling in asking for a referral to do a bone density scan. Asked for a referral to [facility] in [place].

## 2021-01-05 NOTE — Addendum Note (Signed)
Addended by: Loura Pardon A on: 01/05/2021 08:02 PM   Modules accepted: Orders

## 2021-01-05 NOTE — Telephone Encounter (Signed)
I put the order in -please send to [facility] and then pt can schedule it Thanks

## 2021-01-06 NOTE — Telephone Encounter (Signed)
Patient advised that Bone Density order has been faxed to [facility].

## 2021-01-13 DIAGNOSIS — M8589 Other specified disorders of bone density and structure, multiple sites: Secondary | ICD-10-CM | POA: Diagnosis not present

## 2021-01-13 DIAGNOSIS — Z780 Asymptomatic menopausal state: Secondary | ICD-10-CM | POA: Diagnosis not present

## 2021-01-13 LAB — HM DEXA SCAN

## 2021-01-23 ENCOUNTER — Encounter: Payer: Self-pay | Admitting: Family Medicine

## 2021-03-17 DIAGNOSIS — R531 Weakness: Secondary | ICD-10-CM | POA: Diagnosis not present

## 2021-03-17 DIAGNOSIS — K921 Melena: Secondary | ICD-10-CM | POA: Diagnosis not present

## 2021-04-27 ENCOUNTER — Telehealth: Payer: Self-pay

## 2021-04-27 DIAGNOSIS — M810 Age-related osteoporosis without current pathological fracture: Secondary | ICD-10-CM

## 2021-04-27 NOTE — Telephone Encounter (Signed)
Pt called stating that she will be due for her Prolia soon and thought she would have had a call to get this set up... Please advise

## 2021-04-29 NOTE — Telephone Encounter (Signed)
Benefit verification submitted-pending decision. 

## 2021-04-29 NOTE — Telephone Encounter (Signed)
Working on this today. Called patient and left a message letting her know that I am working on prolia benefit submissions and she will be due for Prolia after June 2nd and that I would be back in touch with her about this.

## 2021-05-13 NOTE — Telephone Encounter (Signed)
Benefits received. OOP cost is $0.  Authorization extended out to 12/12/2021, Auth # [id number]  Left message for patient to call back to schedule lab and NV appointment.

## 2021-05-20 NOTE — Telephone Encounter (Signed)
Scheduled labs on 05/21/21 and NV on 05/26/21

## 2021-05-20 NOTE — Addendum Note (Signed)
Addended by: Kris Mouton on: 05/20/2021 12:39 PM   Modules accepted: Orders

## 2021-05-21 ENCOUNTER — Other Ambulatory Visit (INDEPENDENT_AMBULATORY_CARE_PROVIDER_SITE_OTHER): Payer: Medicare PPO

## 2021-05-21 ENCOUNTER — Other Ambulatory Visit: Payer: Self-pay

## 2021-05-21 DIAGNOSIS — M810 Age-related osteoporosis without current pathological fracture: Secondary | ICD-10-CM

## 2021-05-21 LAB — BASIC METABOLIC PANEL
BUN: 15 mg/dL (ref 6–23)
CO2: 31 mEq/L (ref 19–32)
Calcium: 9.3 mg/dL (ref 8.4–10.5)
Chloride: 102 mEq/L (ref 96–112)
Creatinine, Ser: 0.76 mg/dL (ref 0.40–1.20)
GFR: 75.7 mL/min (ref >60.00)
Glucose, Bld: 91 mg/dL (ref 70–99)
Potassium: 4 mEq/L (ref 3.5–5.1)
Sodium: 139 mEq/L (ref 135–145)

## 2021-05-21 NOTE — Progress Notes (Signed)
Lab only 

## 2021-05-22 NOTE — Telephone Encounter (Signed)
Creatinine Calculation is 56.17 mL/min-under 60 that is recommended. Sending to PCP to make sure it is still ok to proceed. Calcium was normal at 9.3

## 2021-05-22 NOTE — Telephone Encounter (Signed)
Noted! Thank you

## 2021-05-22 NOTE — Telephone Encounter (Signed)
Aware, please proceed with Prolia

## 2021-05-26 ENCOUNTER — Telehealth: Payer: Self-pay

## 2021-05-26 ENCOUNTER — Ambulatory Visit: Payer: Medicare PPO

## 2021-05-26 NOTE — Telephone Encounter (Signed)
[facility] Night - Client Nonclinical Telephone Record AccessNurse Client [facility] Night - Client Client Site [place] Physician Tower, Roque Lias - MD Contact Type Call Who Is Calling Patient / Member / Family / Caregiver Caller Name KOHANA AMBLE Phone Number [phone] Patient Name Kylie Acosta Patient DOB 05-Mar-1946 Call Type Message Only Information Provided Reason for Call Request to Reschedule Office Appointment Initial Comment Caller states she has an appt in the morning for an injection and she would like to reschedule. Patient request to speak to RN No Additional Comment Provided office hours. Caller declined triage. Disp. Time Disposition Final User 05/25/2021 5:59:02 PM General Information Provided Yes Silvestre Moment Call Closed By: Silvestre Moment Transaction Date/Time: 05/25/2021 5:56:20 PM (ET)

## 2021-05-28 ENCOUNTER — Ambulatory Visit (INDEPENDENT_AMBULATORY_CARE_PROVIDER_SITE_OTHER): Payer: Medicare PPO | Admitting: *Deleted

## 2021-05-28 ENCOUNTER — Other Ambulatory Visit: Payer: Self-pay

## 2021-05-28 DIAGNOSIS — M810 Age-related osteoporosis without current pathological fracture: Secondary | ICD-10-CM

## 2021-05-28 MED ORDER — DENOSUMAB 60 MG/ML ~~LOC~~ SOSY
60.0000 mg | PREFILLED_SYRINGE | Freq: Once | SUBCUTANEOUS | Status: AC
  Administered 2021-05-28: 60 mg via SUBCUTANEOUS

## 2021-05-28 NOTE — Progress Notes (Addendum)
Per orders of Dr. Einar Pheasant, injection of Prolia given by Tammi Sou. Patient tolerated injection well.   PCP out of the office today

## 2021-06-09 DIAGNOSIS — J301 Allergic rhinitis due to pollen: Secondary | ICD-10-CM | POA: Diagnosis not present

## 2021-06-09 DIAGNOSIS — H6981 Other specified disorders of Eustachian tube, right ear: Secondary | ICD-10-CM | POA: Diagnosis not present

## 2021-08-09 ENCOUNTER — Ambulatory Visit: Payer: Medicare PPO

## 2021-09-30 DIAGNOSIS — D2261 Melanocytic nevi of right upper limb, including shoulder: Secondary | ICD-10-CM | POA: Diagnosis not present

## 2021-09-30 DIAGNOSIS — D225 Melanocytic nevi of trunk: Secondary | ICD-10-CM | POA: Diagnosis not present

## 2021-09-30 DIAGNOSIS — H61031 Chondritis of right external ear: Secondary | ICD-10-CM | POA: Diagnosis not present

## 2021-09-30 DIAGNOSIS — D2262 Melanocytic nevi of left upper limb, including shoulder: Secondary | ICD-10-CM | POA: Diagnosis not present

## 2021-09-30 DIAGNOSIS — D2272 Melanocytic nevi of left lower limb, including hip: Secondary | ICD-10-CM | POA: Diagnosis not present

## 2021-09-30 DIAGNOSIS — D2271 Melanocytic nevi of right lower limb, including hip: Secondary | ICD-10-CM | POA: Diagnosis not present

## 2021-10-01 ENCOUNTER — Ambulatory Visit: Payer: Medicare PPO | Attending: Internal Medicine

## 2021-10-01 ENCOUNTER — Other Ambulatory Visit: Payer: Self-pay

## 2021-10-01 DIAGNOSIS — Z23 Encounter for immunization: Secondary | ICD-10-CM

## 2021-10-01 MED ORDER — PFIZER COVID-19 VAC BIVALENT 30 MCG/0.3ML IM SUSP
INTRAMUSCULAR | 0 refills | Status: DC
  Filled 2021-10-01: qty 0.3, 1d supply, fill #0

## 2021-10-01 NOTE — Progress Notes (Signed)
   Covid-19 Vaccination Clinic  Name:  Shellene Sweigert    MRN: [id number] DOB: 1946-11-09  10/01/2021  Ms. Mignogna was observed post Covid-19 immunization for 15 minutes without incident. She was provided with Vaccine Information Sheet and instruction to access the V-Safe system.   Ms. Drinkard was instructed to call 911 with any severe reactions post vaccine: Difficulty breathing  Swelling of face and throat  A fast heartbeat  A bad rash all over body  Dizziness and weakness   Immunizations Administered     Name Date Dose VIS Date Route   Pfizer Covid-19 Vaccine Bivalent Booster 10/01/2021  2:46 PM 0.3 mL 08/12/2021 Intramuscular   Manufacturer: [place]   Lot: [id number]: [id number]       Covid-19 Vaccination Clinic  Name:  Demetrius Barrell    MRN: [id number] DOB: Sep 17, 1946  10/01/2021  Ms. Vondra was observed post Covid-19 immunization for 15 minutes without incident. She was provided with Vaccine Information Sheet and instruction to access the V-Safe system.   Ms. Duncanson was instructed to call 911 with any severe reactions post vaccine: Difficulty breathing  Swelling of face and throat  A fast heartbeat  A bad rash all over body  Dizziness and weakness   Immunizations Administered     Name Date Dose VIS Date Route   Pfizer Covid-19 Vaccine Bivalent Booster 10/01/2021  2:46 PM 0.3 mL 08/12/2021 Intramuscular   Manufacturer: [place]   Lot: [id number]   [place]: [phone]

## 2021-10-27 ENCOUNTER — Telehealth: Payer: Self-pay

## 2021-10-27 NOTE — Telephone Encounter (Signed)
Benefits submitted. Next injection due after 11/28/21

## 2021-11-03 ENCOUNTER — Other Ambulatory Visit: Payer: Self-pay | Admitting: Family Medicine

## 2021-11-04 NOTE — Telephone Encounter (Signed)
Pt is due for her AWV after 11/13/21, please schedule then route back to me to refill med

## 2021-11-18 NOTE — Telephone Encounter (Signed)
OOP cost is $0. [place] on file Authorization extended out to 12/12/2021, Auth # [id number]. Need lab appointment anytime now and Nurse visit for 11/28/21 and after. Need to reschedule flu shot that is scheduled for 11/26/21 can not give with Prolia injection or close to it.

## 2021-11-23 NOTE — Telephone Encounter (Signed)
Left message for patient to call back  

## 2021-11-26 ENCOUNTER — Ambulatory Visit: Payer: Medicare PPO

## 2021-12-02 ENCOUNTER — Telehealth: Payer: Self-pay | Admitting: Family Medicine

## 2021-12-02 DIAGNOSIS — Z0000 Encounter for general adult medical examination without abnormal findings: Secondary | ICD-10-CM

## 2021-12-02 DIAGNOSIS — M810 Age-related osteoporosis without current pathological fracture: Secondary | ICD-10-CM

## 2021-12-02 DIAGNOSIS — E7800 Pure hypercholesterolemia, unspecified: Secondary | ICD-10-CM

## 2021-12-02 DIAGNOSIS — E559 Vitamin D deficiency, unspecified: Secondary | ICD-10-CM

## 2021-12-02 NOTE — Telephone Encounter (Signed)
Patient has a lab appt 12/27, there are no orders in.

## 2021-12-02 NOTE — Addendum Note (Signed)
Addended by: Loura Pardon A on: 12/02/2021 06:57 PM   Modules accepted: Orders

## 2021-12-08 ENCOUNTER — Other Ambulatory Visit: Payer: Medicare PPO

## 2021-12-09 ENCOUNTER — Other Ambulatory Visit (INDEPENDENT_AMBULATORY_CARE_PROVIDER_SITE_OTHER): Payer: Medicare PPO

## 2021-12-09 ENCOUNTER — Other Ambulatory Visit: Payer: Self-pay

## 2021-12-09 DIAGNOSIS — E7800 Pure hypercholesterolemia, unspecified: Secondary | ICD-10-CM | POA: Diagnosis not present

## 2021-12-09 DIAGNOSIS — E559 Vitamin D deficiency, unspecified: Secondary | ICD-10-CM | POA: Diagnosis not present

## 2021-12-09 DIAGNOSIS — Z0000 Encounter for general adult medical examination without abnormal findings: Secondary | ICD-10-CM | POA: Diagnosis not present

## 2021-12-10 ENCOUNTER — Encounter: Payer: Self-pay | Admitting: *Deleted

## 2021-12-10 ENCOUNTER — Ambulatory Visit: Payer: Medicare PPO

## 2021-12-10 DIAGNOSIS — Z1231 Encounter for screening mammogram for malignant neoplasm of breast: Secondary | ICD-10-CM | POA: Diagnosis not present

## 2021-12-10 LAB — VITAMIN D 25 HYDROXY (VIT D DEFICIENCY, FRACTURES): VITD: 66.33 ng/mL (ref 30.00–100.00)

## 2021-12-10 LAB — COMPREHENSIVE METABOLIC PANEL
ALT: 17 U/L (ref 0–35)
AST: 23 U/L (ref 0–37)
Albumin: 4.3 g/dL (ref 3.5–5.2)
Alkaline Phosphatase: 44 U/L (ref 39–117)
BUN: 18 mg/dL (ref 6–23)
CO2: 31 mEq/L (ref 19–32)
Calcium: 9.2 mg/dL (ref 8.4–10.5)
Chloride: 101 mEq/L (ref 96–112)
Creatinine, Ser: 0.8 mg/dL (ref 0.40–1.20)
GFR: 70.91 mL/min (ref >60.00)
Glucose, Bld: 82 mg/dL (ref 70–99)
Potassium: 3.9 mEq/L (ref 3.5–5.1)
Sodium: 141 mEq/L (ref 135–145)
Total Bilirubin: 0.5 mg/dL (ref 0.2–1.2)
Total Protein: 6.5 g/dL (ref 6.0–8.3)

## 2021-12-10 LAB — CBC WITH DIFFERENTIAL/PLATELET
Basophils Absolute: 0.1 10*3/uL (ref 0.0–0.1)
Basophils Relative: 0.7 % (ref 0.0–3.0)
Eosinophils Absolute: 0.3 10*3/uL (ref 0.0–0.7)
Eosinophils Relative: 3.7 % (ref 0.0–5.0)
HCT: 39.2 % (ref 36.0–46.0)
Hemoglobin: 13 g/dL (ref 12.0–15.0)
Lymphocytes Relative: 17.7 % (ref 12.0–46.0)
Lymphs Abs: 1.4 10*3/uL (ref 0.7–4.0)
MCHC: 33.3 g/dL (ref 30.0–36.0)
MCV: 96.2 fl (ref 78.0–100.0)
Monocytes Absolute: 0.4 10*3/uL (ref 0.1–1.0)
Monocytes Relative: 5.4 % (ref 3.0–12.0)
Neutro Abs: 5.9 10*3/uL (ref 1.4–7.7)
Neutrophils Relative %: 72.5 % (ref 43.0–77.0)
Platelets: 258 10*3/uL (ref 150.0–400.0)
RBC: 4.08 Mil/uL (ref 3.87–5.11)
RDW: 12.5 % (ref 11.5–15.5)
WBC: 8.2 10*3/uL (ref 4.0–10.5)

## 2021-12-10 LAB — HM MAMMOGRAPHY

## 2021-12-10 LAB — LIPID PANEL
Cholesterol: 173 mg/dL (ref 0–200)
HDL: 69.8 mg/dL (ref >39.00)
LDL Cholesterol: 89 mg/dL (ref 0–99)
NonHDL: 103.18
Total CHOL/HDL Ratio: 2
Triglycerides: 70 mg/dL (ref 0.0–149.0)
VLDL: 14 mg/dL (ref 0.0–40.0)

## 2021-12-10 LAB — TSH: TSH: 3.79 u[IU]/mL (ref 0.35–5.50)

## 2021-12-10 NOTE — Telephone Encounter (Signed)
LAB done on 12/09/21 Calcium normal at 9.2 CR CL is 56.17 mL/min  Nurse Visit on 12/11/21 at 9 am

## 2021-12-11 ENCOUNTER — Ambulatory Visit (INDEPENDENT_AMBULATORY_CARE_PROVIDER_SITE_OTHER): Payer: Medicare PPO

## 2021-12-11 DIAGNOSIS — M810 Age-related osteoporosis without current pathological fracture: Secondary | ICD-10-CM

## 2021-12-11 MED ORDER — DENOSUMAB 60 MG/ML ~~LOC~~ SOSY
60.0000 mg | PREFILLED_SYRINGE | Freq: Once | SUBCUTANEOUS | Status: AC
Start: 2021-12-11 — End: 2021-12-11
  Administered 2021-12-11: 09:00:00 60 mg via SUBCUTANEOUS

## 2021-12-11 NOTE — Progress Notes (Signed)
Per orders of Dr. Tower, injection of Prolia given by Tycho Cheramie G Dakiyah Heinke. ?Patient tolerated injection well.  ? ?

## 2021-12-22 ENCOUNTER — Other Ambulatory Visit: Payer: Self-pay | Admitting: Family Medicine

## 2021-12-22 ENCOUNTER — Other Ambulatory Visit: Payer: Medicare PPO

## 2021-12-29 ENCOUNTER — Encounter: Payer: Medicare PPO | Admitting: Family Medicine

## 2021-12-31 ENCOUNTER — Encounter: Payer: Self-pay | Admitting: Family Medicine

## 2022-01-01 DIAGNOSIS — M7061 Trochanteric bursitis, right hip: Secondary | ICD-10-CM | POA: Diagnosis not present

## 2022-01-08 NOTE — Progress Notes (Signed)
Subjective:   Kylie Acosta is a 76 y.o. female who presents for Medicare Annual (Subsequent) preventive examination.  I connected with [name] today by telephone and verified that I am speaking with the correct person using two identifiers. Location patient: home Location provider: work Persons participating in the virtual visit: patient, Marine scientist.    I discussed the limitations, risks, security and privacy concerns of performing an evaluation and management service by telephone and the availability of in person appointments. I also discussed with the patient that there may be a patient responsible charge related to this service. The patient expressed understanding and verbally consented to this telephonic visit.    Interactive audio and video telecommunications were attempted between this provider and patient, however failed, due to patient having technical difficulties OR patient did not have access to video capability.  We continued and completed visit with audio only.  Some vital signs may be absent or patient reported.   Time Spent with patient on telephone encounter: 25 minutes  Review of Systems     Cardiac Risk Factors include: advanced age (>53men, >49 women);dyslipidemia     Objective:    Today's Vitals   01/11/22 0944  Weight: 126 lb (57.2 kg)  Height: 5\' 4"  (1.626 m)   Body mass index is 21.63 kg/m.  Advanced Directives 01/11/2022 10/29/2019 08/18/2018 10/21/2017 10/11/2017 08/09/2017 07/08/2016  Does Patient Have a Medical Advance Directive? Yes Yes Yes Yes Yes Yes Yes  Type of Paramedic of [place];Living will [place];Living will [place];Living will [place];Living will [place];Living will [place];Living will [place];Living will  Does patient want to make changes to medical advance directive? Yes  (MAU/Ambulatory/Procedural Areas - Information given) - - - - No - Patient declined No - Patient declined  Copy of [place] in Chart? Yes - validated most recent copy scanned in chart (See row information) Yes - validated most recent copy scanned in chart (See row information) Yes - - No - copy requested No - copy requested  Would patient like information on creating a medical advance directive? - - No - Patient declined - - - -    Current Medications (verified) Outpatient Encounter Medications as of 01/11/2022  Medication Sig   Calcium Carbonate-Vit D-Min (CALCIUM 1200 PO) Take 1 capsule by mouth daily.   cholecalciferol (VITAMIN D) 1000 UNITS tablet Take 2,000 Units by mouth daily.    COVID-19 mRNA bivalent vaccine, Pfizer, (PFIZER COVID-19 VAC BIVALENT) injection Inject into the muscle.   denosumab (PROLIA) 60 MG/ML SOSY injection Inject 60 mg into the skin every 6 (six) months.   desoximetasone (TOPICORT) 0.25 % cream Apply 1 application topically 2 (two) times daily. To eczema on hands   fluticasone (FLONASE) 50 MCG/ACT nasal spray SHAKE LIQUID AND USE 2 SPRAYS IN EACH NOSTRIL EVERY DAY   Multiple Vitamin (MULTIVITAMIN) capsule Take 1 capsule by mouth daily.   simvastatin (ZOCOR) 20 MG tablet TAKE 1 TABLET(20 MG) BY MOUTH DAILY   meloxicam (MOBIC) 7.5 MG tablet TAKE 1 TABLET(7.5 MG) BY MOUTH DAILY WITH A MEAL AS NEEDED FOR PAIN (Patient not taking: Reported on 01/11/2022)   No facility-administered encounter medications on file as of 01/11/2022.    Allergies (verified) Benadryl [diphenhydramine], Alendronate sodium, Atorvastatin, Famotidine, Omeprazole, Raloxifene, and Sulfonamide derivatives   History: Past Medical History:  Diagnosis Date   Allergy    allerlgic rhinitis  Hyperlipidemia    Osteopenia    Osteoporosis    Past Surgical History:  Procedure Laterality Date   ABDOMINAL HYSTERECTOMY  1997   fibroids   COLONOSCOPY     [place]/2005   HIP SURGERY   08/14/2019   Family History  Problem Relation Age of Onset   Colon cancer Paternal Aunt    Colon polyps Neg Hx    Esophageal cancer Neg Hx    Rectal cancer Neg Hx    Stomach cancer Neg Hx    Social History   Socioeconomic History   Marital status: Married    Spouse name: Not on file   Number of children: Not on file   Years of education: Not on file   Highest education level: Not on file  Occupational History   Not on file  Tobacco Use   Smoking status: Never   Smokeless tobacco: Never  Vaping Use   Vaping Use: Never used  Substance and Sexual Activity   Alcohol use: No    Alcohol/week: 0.0 standard drinks   Drug use: No   Sexual activity: Never  Other Topics Concern   Not on file  Social History Narrative   Not on file   Social Determinants of Health   Financial Resource Strain: Low Risk    Difficulty of Paying Living Expenses: Not hard at all  Food Insecurity: No Food Insecurity   Worried About Charity fundraiser in the Last Year: Never true   Ran Out of Food in the Last Year: Never true  Transportation Needs: No Transportation Needs   Lack of Transportation (Medical): No   Lack of Transportation (Non-Medical): No  Physical Activity: Unknown   Days of Exercise per Week: Not on file   Minutes of Exercise per Session: 30 min  Stress: No Stress Concern Present   Feeling of Stress : Not at all  Social Connections: Socially Integrated   Frequency of Communication with Friends and Family: More than three times a week   Frequency of Social Gatherings with Friends and Family: More than three times a week   Attends Religious Services: More than 4 times per year   Active Member of [employer] or Organizations: Yes   Attends Music therapist: More than 4 times per year   Marital Status: Married    Tobacco Counseling Counseling given: Not Answered   Clinical Intake:  Pre-visit preparation completed: Yes  Pain : No/denies pain     BMI - recorded:  21.63 Nutritional Status: BMI of 19-24  Normal Nutritional Risks: None Diabetes: No  How often do you need to have someone help you when you read instructions, pamphlets, or other written materials from your doctor or pharmacy?: 1 - Never  Diabetic? No  Interpreter Needed?: No  Information entered by :: Orrin Brigham LPN   Activities of Daily Living In your present state of health, do you have any difficulty performing the following activities: 01/11/2022  Hearing? N  Vision? N  Difficulty concentrating or making decisions? N  Walking or climbing stairs? N  Dressing or bathing? N  Doing errands, shopping? N  Preparing Food and eating ? N  Using the Toilet? N  In the past six months, have you accidently leaked urine? Y  Do you have problems with loss of bowel control? N  Managing your Medications? N  Managing your Finances? N  Housekeeping or managing your Housekeeping? N  Some recent data might be hidden    Patient Care Team:  Tower, Wynelle Fanny, MD as PCP - General Ralene Bathe, MD as Referring Physician (Dermatology) Garfield Cornea, OD as Referring Physician (Optometry)  Indicate any recent Medical Services you may have received from other than [facility] providers in the past year (date may be approximate).     Assessment:   This is a routine wellness examination for [place].  Hearing/Vision screen Hearing Screening - Comments:: No issues Vision Screening - Comments:: Last exam 2022, Dr. Atilano Median, wear glasses  Dietary issues and exercise activities discussed: Current Exercise Habits: Home exercise routine, Type of exercise: walking, Time (Minutes): 35   Goals Addressed             This Visit's Progress    Patient Stated       Would like to maintain current routine.        Depression Screen PHQ 2/9 Scores 01/11/2022 11/13/2020 10/29/2019 08/18/2018 08/09/2017 07/08/2016 08/04/2015  PHQ - 2 Score 0 0 0 0 0 0 0  PHQ- 9 Score - - 0 0 0 - -    Fall Risk Fall Risk   01/11/2022 11/13/2020 10/29/2019 08/18/2018 08/09/2017  Falls in the past year? 0 1 1 No No  Comment - - fell off a stool - -  Number falls in past yr: 0 0 0 - -  Injury with Fall? 0 0 0 - -  Risk for fall due to : No Fall Risks - Medication side effect;Impaired mobility - -  Follow up Falls prevention discussed Falls evaluation completed Falls evaluation completed;Falls prevention discussed - -    FALL RISK PREVENTION PERTAINING TO THE HOME:  Any stairs in or around the home? Yes  If so, are there any without handrails? No  Home free of loose throw rugs in walkways, pet beds, electrical cords, etc? Yes  Adequate lighting in your home to reduce risk of falls? Yes   ASSISTIVE DEVICES UTILIZED TO PREVENT FALLS:  Life alert? No  Use of a cane, walker or w/c? No  Grab bars in the bathroom? Yes  Shower chair or bench in shower? Yes  Elevated toilet seat or a handicapped toilet? Yes   TIMED UP AND GO:  Was the test performed? No .    Cognitive Function: Normal cognitive status assessed by  this Nurse Health Advisor. No abnormalities found.   MMSE - Mini Mental State Exam 10/29/2019 08/18/2018 08/09/2017 07/08/2016  Orientation to time 5 5 5 5   Orientation to Place 5 5 5 5   Registration 3 3 3 3   Attention/ Calculation 5 0 0 0  Recall 3 3 3 3   Language- name 2 objects - 0 0 0  Language- repeat 1 1 1 1   Language- follow 3 step command - 3 3 3   Language- read & follow direction - 0 0 0  Write a sentence - 0 0 0  Copy design - 0 0 0  Total score - 20 20 20         Immunizations Immunization History  Administered Date(s) Administered   Fluad Quad(high Dose 65+) 11/02/2019   Influenza Split 11/30/2011, 11/29/2012   Influenza Whole 09/12/2002, 10/11/2008, 10/07/2009, 10/22/2010   Influenza,inj,Quad PF,6+ Mos 11/07/2013, 10/22/2015, 10/08/2016, 10/13/2017, 08/22/2018   Influenza-Unspecified 11/12/2021   PFIZER(Purple Top)SARS-COV-2 Vaccination 01/02/2020, 01/25/2020, 12/22/2020    Pfizer Covid-19 Vaccine Bivalent Booster 33yrs & up 10/01/2021   Pneumococcal Conjugate-13 05/01/2014   Pneumococcal Polysaccharide-23 01/11/2012   Td 10/14/2003, 04/06/2013   Zoster, Live 07/24/2007    TDAP status: Up to date  Flu Vaccine status: Up to date  Pneumococcal vaccine status: Up to date  Covid-19 vaccine status: Completed vaccines  Qualifies for Shingles Vaccine? Yes   Zostavax completed Yes   Shingrix Completed?: No.    Education has been provided regarding the importance of this vaccine. Patient has been advised to call insurance company to determine out of pocket expense if they have not yet received this vaccine. Advised may also receive vaccine at local pharmacy or Health Dept. Verbalized acceptance and understanding.  Screening Tests Health Maintenance  Topic Date Due   Zoster Vaccines- Shingrix (1 of 2) Never done   COLONOSCOPY (Pts 45-52yrs Insurance coverage will need to be confirmed)  10/21/2022   MAMMOGRAM  12/10/2022   TETANUS/TDAP  04/07/2023   Pneumonia Vaccine 61+ Years old  Completed   INFLUENZA VACCINE  Completed   DEXA SCAN  Completed   COVID-19 Vaccine  Completed   Hepatitis C Screening  Completed   HPV VACCINES  Aged Out    Health Maintenance  Health Maintenance Due  Topic Date Due   Zoster Vaccines- Shingrix (1 of 2) Never done    Colorectal cancer screening: No longer required.   Mammogram status: Completed 12/10/21. Repeat every year  Bone Density status: Completed 01/13/21. Results reflect: Bone density results: OSTEOPOROSIS. Repeat every 2 years.  Lung Cancer Screening: (Low Dose CT Chest recommended if Age 25-80 years, 30 pack-year currently smoking OR have quit w/in 15years.) does not qualify.     Additional Screening:  Hepatitis C Screening: does qualify; Completed 08/09/17  Vision Screening: Recommended annual ophthalmology exams for early detection of glaucoma and other disorders of the eye. Is the patient up to date with  their annual eye exam?  Yes  Who is the provider or what is the name of the office in which the patient attends annual eye exams? Dr. Atilano Median   Dental Screening: Recommended annual dental exams for proper oral hygiene  Community Resource Referral / Chronic Care Management: CRR required this visit?  No   CCM required this visit?  No      Plan:     I have personally reviewed and noted the following in the patients chart:   Medical and social history Use of alcohol, tobacco or illicit drugs  Current medications and supplements including opioid prescriptions.  Functional ability and status Nutritional status Physical activity Advanced directives List of other physicians Hospitalizations, surgeries, and ER visits in previous 12 months Vitals Screenings to include cognitive, depression, and falls Referrals and appointments  In addition, I have reviewed and discussed with patient certain preventive protocols, quality metrics, and best practice recommendations. A written personalized care plan for preventive services as well as general preventive health recommendations were provided to patient.   Due to this being a telephonic visit, the after visit summary with patients personalized plan was offered to patient via mail or my-chart. Patient preferred to pick up at office at next visit.    Loma Messing, LPN   [id number]   Nurse Health Advisor  Nurse Notes: none

## 2022-01-11 ENCOUNTER — Ambulatory Visit (INDEPENDENT_AMBULATORY_CARE_PROVIDER_SITE_OTHER): Payer: Medicare PPO

## 2022-01-11 VITALS — Ht 64.0 in | Wt 126.0 lb

## 2022-01-11 DIAGNOSIS — Z0000 Encounter for general adult medical examination without abnormal findings: Secondary | ICD-10-CM

## 2022-01-11 NOTE — Patient Instructions (Signed)
Ms. Langland , Thank you for taking time to complete your Medicare Wellness Visit. I appreciate your ongoing commitment to your health goals. Please review the following plan we discussed and let me know if I can assist you in the future.   Screening recommendations/referrals: Colonoscopy: no longer required  Mammogram: up to date, completed 12/10/21, due 12/10/22 Bone Density: up to date, completed 01/13/21, due 01/13/23 Recommended yearly ophthalmology/optometry visit for glaucoma screening and checkup Recommended yearly dental visit for hygiene and checkup  Vaccinations: Influenza vaccine: up to date Pneumococcal vaccine: up to date Tdap vaccine: up to date, completed 04/06/13, due 04/07/23 Shingles vaccine: Discuss with your local pharmacy   Covid-19:up to date  Advanced directives: copy on file   Conditions/risks identified: see problem list  Next appointment: Follow up in one year for your annual wellness visit    Preventive Care 65 Years and Older, Female Preventive care refers to lifestyle choices and visits with your health care provider that can promote health and wellness. What does preventive care include? A yearly physical exam. This is also called an annual well check. Dental exams once or twice a year. Routine eye exams. Ask your health care provider how often you should have your eyes checked. Personal lifestyle choices, including: Daily care of your teeth and gums. Regular physical activity. Eating a healthy diet. Avoiding tobacco and drug use. Limiting alcohol use. Practicing safe sex. Taking low-dose aspirin every day. Taking vitamin and mineral supplements as recommended by your health care provider. What happens during an annual well check? The services and screenings done by your health care provider during your annual well check will depend on your age, overall health, lifestyle risk factors, and family history of disease. Counseling  Your health care provider  may ask you questions about your: Alcohol use. Tobacco use. Drug use. Emotional well-being. Home and relationship well-being. Sexual activity. Eating habits. History of falls. Memory and ability to understand (cognition). Work and work Statistician. Reproductive health. Screening  You may have the following tests or measurements: Height, weight, and BMI. Blood pressure. Lipid and cholesterol levels. These may be checked every 5 years, or more frequently if you are over 12 years old. Skin check. Lung cancer screening. You may have this screening every year starting at age 70 if you have a 30-pack-year history of smoking and currently smoke or have quit within the past 15 years. Fecal occult blood test (FOBT) of the stool. You may have this test every year starting at age 7. Flexible sigmoidoscopy or colonoscopy. You may have a sigmoidoscopy every 5 years or a colonoscopy every 10 years starting at age 62. Hepatitis C blood test. Hepatitis B blood test. Sexually transmitted disease (STD) testing. Diabetes screening. This is done by checking your blood sugar (glucose) after you have not eaten for a while (fasting). You may have this done every 1-3 years. Bone density scan. This is done to screen for osteoporosis. You may have this done starting at age 32. Mammogram. This may be done every 1-2 years. Talk to your health care provider about how often you should have regular mammograms. Talk with your health care provider about your test results, treatment options, and if necessary, the need for more tests. Vaccines  Your health care provider may recommend certain vaccines, such as: Influenza vaccine. This is recommended every year. Tetanus, diphtheria, and acellular pertussis (Tdap, Td) vaccine. You may need a Td booster every 10 years. Zoster vaccine. You may need this after age 78.  Pneumococcal 13-valent conjugate (PCV13) vaccine. One dose is recommended after age 54. Pneumococcal  polysaccharide (PPSV23) vaccine. One dose is recommended after age 48. Talk to your health care provider about which screenings and vaccines you need and how often you need them. This information is not intended to replace advice given to you by your health care provider. Make sure you discuss any questions you have with your health care provider. Document Released: 12/26/2015 Document Revised: 08/18/2016 Document Reviewed: 09/30/2015 Elsevier Interactive Patient Education  2017 [place] Prevention in the Home Falls can cause injuries. They can happen to people of all ages. There are many things you can do to make your home safe and to help prevent falls. What can I do on the outside of my home? Regularly fix the edges of walkways and driveways and fix any cracks. Remove anything that might make you trip as you walk through a door, such as a raised step or threshold. Trim any bushes or trees on the path to your home. Use bright outdoor lighting. Clear any walking paths of anything that might make someone trip, such as rocks or tools. Regularly check to see if handrails are loose or broken. Make sure that both sides of any steps have handrails. Any raised decks and porches should have guardrails on the edges. Have any leaves, snow, or ice cleared regularly. Use sand or salt on walking paths during winter. Clean up any spills in your garage right away. This includes oil or grease spills. What can I do in the bathroom? Use night lights. Install grab bars by the toilet and in the tub and shower. Do not use towel bars as grab bars. Use non-skid mats or decals in the tub or shower. If you need to sit down in the shower, use a plastic, non-slip stool. Keep the floor dry. Clean up any water that spills on the floor as soon as it happens. Remove soap buildup in the tub or shower regularly. Attach bath mats securely with double-sided non-slip rug tape. Do not have throw rugs and other  things on the floor that can make you trip. What can I do in the bedroom? Use night lights. Make sure that you have a light by your bed that is easy to reach. Do not use any sheets or blankets that are too big for your bed. They should not hang down onto the floor. Have a firm chair that has side arms. You can use this for support while you get dressed. Do not have throw rugs and other things on the floor that can make you trip. What can I do in the kitchen? Clean up any spills right away. Avoid walking on wet floors. Keep items that you use a lot in easy-to-reach places. If you need to reach something above you, use a strong step stool that has a grab bar. Keep electrical cords out of the way. Do not use floor polish or wax that makes floors slippery. If you must use wax, use non-skid floor wax. Do not have throw rugs and other things on the floor that can make you trip. What can I do with my stairs? Do not leave any items on the stairs. Make sure that there are handrails on both sides of the stairs and use them. Fix handrails that are broken or loose. Make sure that handrails are as long as the stairways. Check any carpeting to make sure that it is firmly attached to the stairs. Fix any  carpet that is loose or worn. Avoid having throw rugs at the top or bottom of the stairs. If you do have throw rugs, attach them to the floor with carpet tape. Make sure that you have a light switch at the top of the stairs and the bottom of the stairs. If you do not have them, ask someone to add them for you. What else can I do to help prevent falls? Wear shoes that: Do not have high heels. Have rubber bottoms. Are comfortable and fit you well. Are closed at the toe. Do not wear sandals. If you use a stepladder: Make sure that it is fully opened. Do not climb a closed stepladder. Make sure that both sides of the stepladder are locked into place. Ask someone to hold it for you, if possible. Clearly  mark and make sure that you can see: Any grab bars or handrails. First and last steps. Where the edge of each step is. Use tools that help you move around (mobility aids) if they are needed. These include: Canes. Walkers. Scooters. Crutches. Turn on the lights when you go into a dark area. Replace any light bulbs as soon as they burn out. Set up your furniture so you have a clear path. Avoid moving your furniture around. If any of your floors are uneven, fix them. If there are any pets around you, be aware of where they are. Review your medicines with your doctor. Some medicines can make you feel dizzy. This can increase your chance of falling. Ask your doctor what other things that you can do to help prevent falls. This information is not intended to replace advice given to you by your health care provider. Make sure you discuss any questions you have with your health care provider. Document Released: 09/25/2009 Document Revised: 05/06/2016 Document Reviewed: 01/03/2015 Elsevier Interactive Patient Education  2017 [employer].

## 2022-01-12 ENCOUNTER — Encounter: Payer: Medicare PPO | Admitting: Family Medicine

## 2022-01-22 ENCOUNTER — Encounter: Payer: Medicare PPO | Admitting: Family Medicine

## 2022-02-07 ENCOUNTER — Other Ambulatory Visit: Payer: Self-pay | Admitting: Family Medicine

## 2022-02-08 ENCOUNTER — Other Ambulatory Visit: Payer: Self-pay | Admitting: Family Medicine

## 2022-02-19 ENCOUNTER — Encounter: Payer: Self-pay | Admitting: Family Medicine

## 2022-02-19 ENCOUNTER — Other Ambulatory Visit: Payer: Self-pay

## 2022-02-19 ENCOUNTER — Ambulatory Visit (INDEPENDENT_AMBULATORY_CARE_PROVIDER_SITE_OTHER): Payer: Medicare PPO | Admitting: Family Medicine

## 2022-02-19 VITALS — BP 128/80 | HR 78 | Temp 97.5°F | Ht 64.0 in | Wt 132.0 lb

## 2022-02-19 DIAGNOSIS — M810 Age-related osteoporosis without current pathological fracture: Secondary | ICD-10-CM | POA: Diagnosis not present

## 2022-02-19 DIAGNOSIS — Z0000 Encounter for general adult medical examination without abnormal findings: Secondary | ICD-10-CM | POA: Diagnosis not present

## 2022-02-19 DIAGNOSIS — E559 Vitamin D deficiency, unspecified: Secondary | ICD-10-CM | POA: Diagnosis not present

## 2022-02-19 DIAGNOSIS — E7800 Pure hypercholesterolemia, unspecified: Secondary | ICD-10-CM | POA: Diagnosis not present

## 2022-02-19 DIAGNOSIS — Z23 Encounter for immunization: Secondary | ICD-10-CM | POA: Insufficient documentation

## 2022-02-19 DIAGNOSIS — Z1211 Encounter for screening for malignant neoplasm of colon: Secondary | ICD-10-CM | POA: Diagnosis not present

## 2022-02-19 DIAGNOSIS — Z7184 Encounter for health counseling related to travel: Secondary | ICD-10-CM | POA: Diagnosis not present

## 2022-02-19 MED ORDER — ATOVAQUONE-PROGUANIL HCL 250-100 MG PO TABS: 1.0000 | ORAL_TABLET | Freq: Every day | ORAL | 0 refills | Status: DC

## 2022-02-19 MED ORDER — SIMVASTATIN 20 MG PO TABS: ORAL_TABLET | ORAL | 3 refills | Status: DC

## 2022-02-19 MED ORDER — FLUTICASONE PROPIONATE 50 MCG/ACT NA SUSP: NASAL | 3 refills | Status: DC

## 2022-02-19 MED ORDER — LEVOFLOXACIN 500 MG PO TABS: 500.0000 mg | ORAL_TABLET | Freq: Every day | ORAL | 0 refills | Status: AC

## 2022-02-19 NOTE — Patient Instructions (Addendum)
If you are interested in the new shingles vaccine (Shingrix) - call your local pharmacy to check on coverage and availability  ?If affordable, get on a wait list at your pharmacy to get the vaccine. ? ? ?I sent in the generic for malorone and levaquin for your trip  ? ?Take care of yourself  ?Labs look stable  ? ?Keep up a good fluid intake  ?Also a healthy balanced diet  ? ? ?

## 2022-02-19 NOTE — Progress Notes (Signed)
Subjective:    Patient ID: Kylie Acosta, female    DOB: [birth year], 76 y.o.   MRN: [id number]  This visit occurred during the SARS-CoV-2 public health emergency.  Safety protocols were in place, including screening questions prior to the visit, additional usage of staff PPE, and extensive cleaning of exam room while observing appropriate contact time as indicated for disinfecting solutions.   HPI Here for health maintenance exam and to review chronic medical problems   Had amw on 01/11/22  Wt Readings from Last 3 Encounters:  02/19/22 132 lb (59.9 kg)  01/11/22 126 lb (57.2 kg)  11/13/20 126 lb 7 oz (57.4 kg)   22.66 kg/m Feeling good Stays active and traveling    Zoster status -interested in the shingrix vaccine -will get later in season  Td 03/2013 Covid immunized  Pna vaccine utd Flu shot   fall 2022   Colonoscopy 11/12018  Due in November for 5 y recall   Mammogram 11/2021 Self breast exam : no lumps   Dexa 01/2021  Osteoporosis  Taking prolia  Falls- none Fractures -none  Takes her ca and D Exercise - walking 2 dogs/ very active   BP Readings from Last 3 Encounters:  02/19/22 128/80  11/13/20 136/84  11/02/19 125/70    Pulse Readings from Last 3 Encounters:  02/19/22 78  11/13/20 70  11/02/19 75     Has trip to [place] planned to see grandkids  Needs px for travelers diarrhea  Also malaria medication   Hyperlipidemia  Lab Results  Component Value Date   CHOL 173 12/09/2021   HDL 69.80 12/09/2021   LDLCALC 89 12/09/2021   LDLDIRECT 151.5 09/01/2010   TRIG 70.0 12/09/2021   CHOLHDL 2 12/09/2021   Simvastatin 20 mg daily   Patient Active Problem List   Diagnosis Date Noted   Travel advice encounter 02/19/2022   Stress reaction 06/29/2019   Estrogen deficiency 07/24/2018   Hx of adenomatous colonic polyps 10/26/2017   Routine general medical examination at a health care facility 07/30/2015   Vitamin D deficiency 07/30/2015   Encounter  for Medicare annual wellness exam 04/06/2013   Colon cancer screening 04/06/2013   GERD 07/07/2010   VAGINITIS, ATROPHIC 10/15/2009   Osteoporosis 07/19/2007   Hyperlipidemia 07/14/2007   ALLERGIC RHINITIS 07/14/2007   Eczema of hand 07/14/2007   Past Medical History:  Diagnosis Date   Allergy    allerlgic rhinitis   Hyperlipidemia    Osteopenia    Osteoporosis    Past Surgical History:  Procedure Laterality Date   ABDOMINAL HYSTERECTOMY  1997   fibroids   COLONOSCOPY     [place]/2005   HIP SURGERY  08/14/2019   Social History   Tobacco Use   Smoking status: Never   Smokeless tobacco: Never  Vaping Use   Vaping Use: Never used  Substance Use Topics   Alcohol use: No    Alcohol/week: 0.0 standard drinks   Drug use: No   Family History  Problem Relation Age of Onset   Colon cancer Paternal Aunt    Colon polyps Neg Hx    Esophageal cancer Neg Hx    Rectal cancer Neg Hx    Stomach cancer Neg Hx    Allergies  Allergen Reactions   Benadryl [Diphenhydramine] Itching    Severe itching. Allergic reaction to cream.   Alendronate Sodium     REACTION: gerd   Atorvastatin     REACTION: muscle and joint pain   Famotidine  REACTION: not effective   Omeprazole     REACTION: not effective   Raloxifene     REACTION: hot flashes   Sulfonamide Derivatives     REACTION: whelps   Current Outpatient Medications on File Prior to Visit  Medication Sig Dispense Refill   Calcium Carbonate-Vit D-Min (CALCIUM 1200 PO) Take 1 capsule by mouth daily.     cholecalciferol (VITAMIN D) 1000 UNITS tablet Take 2,000 Units by mouth daily.      denosumab (PROLIA) 60 MG/ML SOSY injection Inject 60 mg into the skin every 6 (six) months.     desoximetasone (TOPICORT) 0.25 % cream Apply 1 application topically 2 (two) times daily. To eczema on hands 30 g 3   meloxicam (MOBIC) 7.5 MG tablet TAKE 1 TABLET(7.5 MG) BY MOUTH DAILY WITH A MEAL AS NEEDED FOR PAIN 90 tablet 1   Multiple Vitamin  (MULTIVITAMIN) capsule Take 1 capsule by mouth daily.     No current facility-administered medications on file prior to visit.     Review of Systems  Constitutional:  Negative for activity change, appetite change, fatigue, fever and unexpected weight change.  HENT:  Negative for congestion, ear pain, rhinorrhea, sinus pressure and sore throat.   Eyes:  Negative for pain, redness and visual disturbance.  Respiratory:  Negative for cough, shortness of breath and wheezing.   Cardiovascular:  Negative for chest pain and palpitations.  Gastrointestinal:  Negative for abdominal pain, blood in stool, constipation and diarrhea.  Endocrine: Negative for polydipsia and polyuria.  Genitourinary:  Negative for dysuria, frequency and urgency.  Musculoskeletal:  Negative for arthralgias, back pain and myalgias.  Skin:  Negative for pallor and rash.  Allergic/Immunologic: Negative for environmental allergies.  Neurological:  Negative for dizziness, syncope and headaches.  Hematological:  Negative for adenopathy. Does not bruise/bleed easily.  Psychiatric/Behavioral:  Negative for decreased concentration and dysphoric mood. The patient is not nervous/anxious.       Objective:   Physical Exam Constitutional:      General: She is not in acute distress.    Appearance: Normal appearance. She is well-developed and normal weight. She is not ill-appearing or diaphoretic.  HENT:     Head: Normocephalic and atraumatic.     Right Ear: Tympanic membrane, ear canal and external ear normal.     Left Ear: Tympanic membrane, ear canal and external ear normal.     Nose: Nose normal. No congestion.     Mouth/Throat:     Mouth: Mucous membranes are moist.     Pharynx: Oropharynx is clear. No posterior oropharyngeal erythema.  Eyes:     General: No scleral icterus.    Extraocular Movements: Extraocular movements intact.     Conjunctiva/sclera: Conjunctivae normal.     Pupils: Pupils are equal, round, and  reactive to light.  Neck:     Thyroid: No thyromegaly.     Vascular: No carotid bruit or JVD.  Cardiovascular:     Rate and Rhythm: Normal rate and regular rhythm.     Pulses: Normal pulses.     Heart sounds: Normal heart sounds.    No gallop.  Pulmonary:     Effort: Pulmonary effort is normal. No respiratory distress.     Breath sounds: Normal breath sounds. No wheezing.     Comments: Good air exch Chest:     Chest wall: No tenderness.  Abdominal:     General: Bowel sounds are normal. There is no distension or abdominal bruit.  Palpations: Abdomen is soft. There is no mass.     Tenderness: There is no abdominal tenderness.     Hernia: No hernia is present.  Genitourinary:    Comments: Breast exam: No mass, nodules, thickening, tenderness, bulging, retraction, inflamation, nipple discharge or skin changes noted.  No axillary or clavicular LA.     Musculoskeletal:        General: No tenderness. Normal range of motion.     Cervical back: Normal range of motion and neck supple. No rigidity. No muscular tenderness.     Right lower leg: No edema.     Left lower leg: No edema.     Comments: No kyphosis   Lymphadenopathy:     Cervical: No cervical adenopathy.  Skin:    General: Skin is warm and dry.     Coloration: Skin is not pale.     Findings: No erythema or rash.     Comments: Solar lentigines diffusely   Neurological:     Mental Status: She is alert. Mental status is at baseline.     Cranial Nerves: No cranial nerve deficit.     Motor: No abnormal muscle tone.     Coordination: Coordination normal.     Gait: Gait normal.     Deep Tendon Reflexes: Reflexes are normal and symmetric. Reflexes normal.  Psychiatric:        Mood and Affect: Mood normal.        Cognition and Memory: Cognition and memory normal.          Assessment & Plan:   Problem List Items Addressed This Visit       Musculoskeletal and Integument   Osteoporosis    Taking prolia dexa  01/2010 Doing well No falls or fractures Taking ca and D Good exercise         Other   Colon cancer screening    Colonoscopy is due for 5 y recall in November 2018 Pt aware  She will call to schedule 1-2 mo prior      Hyperlipidemia    Disc goals for lipids and reasons to control them Rev last labs with pt Rev low sat fat diet in detail  Labs ordered Simvastatin 20 mg daily  Lab ordered       Relevant Medications   simvastatin (ZOCOR) 20 MG tablet   Routine general medical examination at a health care facility - Primary    Reviewed health habits including diet and exercise and skin cancer prevention Reviewed appropriate screening tests for age  Also reviewed health mt list, fam hx and immunization status , as well as social and family history   See HPI Labs ordered  Planning to get the shingrix vaccine after upcoming travel  Colonoscopy due 10/2022 Mammogram utd  dexa utd/no falls or fx and taking prolia Commended good health habits        Travel advice encounter    For upcoming travel to [place] px  Also for traveler's diarrhea- levaquin (prn)  Will update and f/u if this occurs       Vitamin D deficiency    D level added to labs  Taking 2000 iu D3 daily

## 2022-02-20 NOTE — Assessment & Plan Note (Signed)
Taking prolia ?dexa 01/2010 ?Doing well ?No falls or fractures ?Taking ca and D ?Good exercise  ?

## 2022-02-20 NOTE — Assessment & Plan Note (Signed)
Colonoscopy is due for 5 y recall in November 2018 ?Pt aware  ?She will call to schedule 1-2 mo prior ?

## 2022-02-20 NOTE — Assessment & Plan Note (Signed)
For upcoming travel to [place] ?malarone px  ?Also for traveler's diarrhea- levaquin (prn)  ?Will update and f/u if this occurs  ?

## 2022-02-20 NOTE — Assessment & Plan Note (Signed)
D level added to labs  ?Taking 2000 iu D3 daily  ?

## 2022-02-20 NOTE — Assessment & Plan Note (Signed)
Reviewed health habits including diet and exercise and skin cancer prevention ?Reviewed appropriate screening tests for age  ?Also reviewed health mt list, fam hx and immunization status , as well as social and family history   ?See HPI ?Labs ordered  ?Planning to get the shingrix vaccine after upcoming travel  ?Colonoscopy due 10/2022 ?Mammogram utd  ?dexa utd/no falls or fx and taking prolia ?Commended good health habits ? ? ?

## 2022-02-20 NOTE — Assessment & Plan Note (Signed)
Disc goals for lipids and reasons to control them ?Rev last labs with pt ?Rev low sat fat diet in detail  ?Labs ordered ?Simvastatin 20 mg daily  ?Lab ordered  ?

## 2022-02-26 ENCOUNTER — Other Ambulatory Visit: Payer: Self-pay | Admitting: Family Medicine

## 2022-02-26 DIAGNOSIS — E7800 Pure hypercholesterolemia, unspecified: Secondary | ICD-10-CM

## 2022-05-21 ENCOUNTER — Telehealth: Payer: Self-pay

## 2022-05-21 NOTE — Telephone Encounter (Signed)
Benefits submitted-pending Next injection 06/12/22

## 2022-06-02 ENCOUNTER — Telehealth: Payer: Self-pay | Admitting: Family Medicine

## 2022-06-02 DIAGNOSIS — M810 Age-related osteoporosis without current pathological fracture: Secondary | ICD-10-CM

## 2022-06-02 NOTE — Telephone Encounter (Signed)
-----   Message from Lerry Liner sent at 05/26/2022 10:33 AM EDT ----- Regarding: Thursday June 03, 2022 Labs for June 22, not June 23.  Labs prolia  Thank you

## 2022-06-03 ENCOUNTER — Other Ambulatory Visit (INDEPENDENT_AMBULATORY_CARE_PROVIDER_SITE_OTHER): Payer: Medicare PPO

## 2022-06-03 DIAGNOSIS — M810 Age-related osteoporosis without current pathological fracture: Secondary | ICD-10-CM | POA: Diagnosis not present

## 2022-06-03 LAB — BASIC METABOLIC PANEL
BUN: 18 mg/dL (ref 6–23)
CO2: 32 mEq/L (ref 19–32)
Calcium: 9.7 mg/dL (ref 8.4–10.5)
Chloride: 103 mEq/L (ref 96–112)
Creatinine, Ser: 0.99 mg/dL (ref 0.40–1.20)
GFR: 54.72 mL/min — ABNORMAL LOW (ref >60.00)
Glucose, Bld: 94 mg/dL (ref 70–99)
Potassium: 4 mEq/L (ref 3.5–5.1)
Sodium: 140 mEq/L (ref 135–145)

## 2022-06-03 NOTE — Telephone Encounter (Signed)
[employer] could not pull up benefits for the patient's [employer]. I called [employer] and was advised change has been made after December 2022 that benefits information-cost or percentage of what patient may owe-can not be told to third parties or providers. Patient may be able to get more information if they call directly to the insurance. I was advised that the cost would not be found out until the claim is submitted. Representative did not know why the change was made but could not give my any specific financial information. Reference # for the call [id number] Left message for patient to call back to be advised.  Lab already scheduled 06/03/22 and [place] 06/16/22

## 2022-06-04 ENCOUNTER — Encounter: Payer: Self-pay | Admitting: Family Medicine

## 2022-06-04 DIAGNOSIS — R944 Abnormal results of kidney function studies: Secondary | ICD-10-CM | POA: Insufficient documentation

## 2022-06-16 ENCOUNTER — Ambulatory Visit (INDEPENDENT_AMBULATORY_CARE_PROVIDER_SITE_OTHER): Payer: Medicare PPO

## 2022-06-16 DIAGNOSIS — M810 Age-related osteoporosis without current pathological fracture: Secondary | ICD-10-CM | POA: Diagnosis not present

## 2022-06-16 MED ORDER — DENOSUMAB 60 MG/ML ~~LOC~~ SOSY: 60.0000 mg | PREFILLED_SYRINGE | Freq: Once | SUBCUTANEOUS | Status: AC

## 2022-06-16 NOTE — Progress Notes (Signed)
Per orders of Dr. Diona Browner, in Dr. Alba Cory absence, injection of Prolia given by Loreen Freud. Patient tolerated injection well.

## 2022-07-08 ENCOUNTER — Telehealth: Payer: Self-pay | Admitting: Family Medicine

## 2022-07-08 DIAGNOSIS — R944 Abnormal results of kidney function studies: Secondary | ICD-10-CM

## 2022-07-08 NOTE — Telephone Encounter (Signed)
-----   Message from Ellamae Sia sent at 06/28/2022  2:09 PM EDT ----- Regarding: Lab orders for Friday, 7.28.23 Lab orders, thanks

## 2022-07-09 ENCOUNTER — Other Ambulatory Visit (INDEPENDENT_AMBULATORY_CARE_PROVIDER_SITE_OTHER): Payer: Medicare PPO

## 2022-07-09 DIAGNOSIS — R944 Abnormal results of kidney function studies: Secondary | ICD-10-CM | POA: Diagnosis not present

## 2022-07-09 LAB — POC URINALSYSI DIPSTICK (AUTOMATED)
Bilirubin, UA: NEGATIVE
Glucose, UA: NEGATIVE
Ketones, UA: NEGATIVE
Leukocytes, UA: NEGATIVE
Nitrite, UA: NEGATIVE
Protein, UA: NEGATIVE
Spec Grav, UA: 1.01 (ref 1.010–1.025)
Urobilinogen, UA: 0.2 E.U./dL
pH, UA: 7 (ref 5.0–8.0)

## 2022-07-09 LAB — BASIC METABOLIC PANEL
BUN: 12 mg/dL (ref 6–23)
CO2: 30 mEq/L (ref 19–32)
Calcium: 9.4 mg/dL (ref 8.4–10.5)
Chloride: 102 mEq/L (ref 96–112)
Creatinine, Ser: 0.74 mg/dL (ref 0.40–1.20)
GFR: 77.54 mL/min (ref >60.00)
Glucose, Bld: 78 mg/dL (ref 70–99)
Potassium: 4.1 mEq/L (ref 3.5–5.1)
Sodium: 138 mEq/L (ref 135–145)

## 2022-07-26 ENCOUNTER — Other Ambulatory Visit (INDEPENDENT_AMBULATORY_CARE_PROVIDER_SITE_OTHER): Payer: Medicare PPO

## 2022-07-26 DIAGNOSIS — R8290 Unspecified abnormal findings in urine: Secondary | ICD-10-CM | POA: Insufficient documentation

## 2022-07-26 DIAGNOSIS — Z87448 Personal history of other diseases of urinary system: Secondary | ICD-10-CM | POA: Diagnosis not present

## 2022-07-26 LAB — POCT UA - MICROSCOPIC ONLY: RBC, Urine, Miroscopic: 0 (ref 0–2)

## 2022-07-26 LAB — POCT URINALYSIS DIP (CLINITEK)
Bilirubin, UA: NEGATIVE
Glucose, UA: NEGATIVE mg/dL
Ketones, POC UA: NEGATIVE mg/dL
Leukocytes, UA: NEGATIVE
Nitrite, UA: NEGATIVE
POC PROTEIN,UA: NEGATIVE
Spec Grav, UA: 1.01 (ref 1.010–1.025)
Urobilinogen, UA: 0.2 E.U./dL
pH, UA: 7 (ref 5.0–8.0)

## 2022-07-26 NOTE — Addendum Note (Signed)
Addended by: Loura Pardon A on: 07/26/2022 07:33 PM   Modules accepted: Orders

## 2022-09-08 DIAGNOSIS — H11123 Conjunctival concretions, bilateral: Secondary | ICD-10-CM | POA: Diagnosis not present

## 2022-09-08 DIAGNOSIS — Z961 Presence of intraocular lens: Secondary | ICD-10-CM | POA: Diagnosis not present

## 2022-09-30 DIAGNOSIS — B001 Herpesviral vesicular dermatitis: Secondary | ICD-10-CM | POA: Diagnosis not present

## 2022-09-30 DIAGNOSIS — D2272 Melanocytic nevi of left lower limb, including hip: Secondary | ICD-10-CM | POA: Diagnosis not present

## 2022-09-30 DIAGNOSIS — D225 Melanocytic nevi of trunk: Secondary | ICD-10-CM | POA: Diagnosis not present

## 2022-09-30 DIAGNOSIS — D2261 Melanocytic nevi of right upper limb, including shoulder: Secondary | ICD-10-CM | POA: Diagnosis not present

## 2022-09-30 DIAGNOSIS — D2262 Melanocytic nevi of left upper limb, including shoulder: Secondary | ICD-10-CM | POA: Diagnosis not present

## 2022-09-30 DIAGNOSIS — L821 Other seborrheic keratosis: Secondary | ICD-10-CM | POA: Diagnosis not present

## 2022-09-30 DIAGNOSIS — D2271 Melanocytic nevi of right lower limb, including hip: Secondary | ICD-10-CM | POA: Diagnosis not present

## 2022-10-06 ENCOUNTER — Other Ambulatory Visit: Payer: Self-pay | Admitting: Family Medicine

## 2022-10-21 ENCOUNTER — Telehealth: Payer: Self-pay | Admitting: Family Medicine

## 2022-10-21 NOTE — Telephone Encounter (Signed)
Aware  Enc to push fluids ER precautions

## 2022-10-21 NOTE — Telephone Encounter (Signed)
Agree with precautions given to pt  Agree with nurse assessment in plan.  Thank you for speaking with them. 

## 2022-10-21 NOTE — Telephone Encounter (Addendum)
I spoke with pt;and pt notified as instructed by Dr Glori Bickers and pt voiced understanding. UC & ED precautions given and pt voiced understanding. Sending note to Red Christians FNP who pt has appt with on 10/22/22 at 8:40 and [place] [facility].      [facility] - Client TELEPHONE ADVICE RECORD [facility] Patient Name: Kylie Acosta Gender: Female DOB: [birth year] Age: 76 Y 45 M 10 D Return Phone Number: [phone] (Primary), [phone] (Secondary) Address: City/ State/ Zip: [place]  [address] Client [facility] - Client Client Site [place] Provider Glori Bickers, Roque Lias - MD Contact Type Call Who Is Calling Patient / Member / Family / Caregiver Call Type Triage / Clinical Relationship To Patient Self Return Phone Number [phone] (Primary) Chief Complaint Urine, Blood In Reason for Call Symptomatic / Request for [place] states she is calling from the office with a pt on the line experiencing blood in her urine and experiencing uncomfortable when she sits. Translation No Nurse Assessment Nurse: Claiborne Billings, RN, Kim Date/Time (Eastern Time): 10/21/2022 12:54:27 PM Confirm and document reason for call. If symptomatic, describe symptoms. ---Caller states she has had blood in her urine every time she urinates today. No blood clots. No fever, back or flank pain. Does the patient have any new or worsening symptoms? ---Yes Will a triage be completed? ---Yes Related visit to physician within the last 2 weeks? ---No Does the PT have any chronic conditions? (i.e. diabetes, asthma, this includes High risk factors for pregnancy, etc.) ---No Is this a behavioral health or substance abuse call? ---No Guidelines Guideline Title Affirmed Question Affirmed Notes Nurse Date/Time (Eastern Time) Urine - Blood In Passing pure blood or large blood clots (i.e., size > a  dime) (Exception: Fleck or small strands.) Claiborne Billings, RN, Kim 10/21/2022 12:56:34 PM Disp. Time Eilene Ghazi Time) Disposition Final User 10/21/2022 12:59:14 PM Go to ED Now Yes Claiborne Billings, RN, Kim Final Disposition 10/21/2022 12:59:14 PM Go to ED Now Yes Claiborne Billings, RN, Kim PLEASE NOTE: All timestamps contained within this report are represented as [place] Standard Time. CONFIDENTIALTY NOTICE: This fax transmission is intended only for the addressee. It contains information that is legally privileged, confidential or otherwise protected from use or disclosure. If you are not the intended recipient, you are strictly prohibited from reviewing, disclosing, copying using or disseminating any of this information or taking any action in reliance on or regarding this information. If you have received this fax in error, please notify us immediately by telephone so that we can arrange for its return to [place]. Phone: [phone], Toll-Free: [phone], Fax: [phone] Page: 2 of 2 Call Id: [id number] Caller Disagree/Comply Disagree Caller Understands Yes PreDisposition InappropriateToAsk Care Advice Given Per Guideline GO TO ED NOW: * You need to be seen in the Emergency Department. * Go to the ED at ___________ [facility] now. Drive carefully. ANOTHER ADULT SHOULD DRIVE: * It is better and safer if another adult drives instead of you. BRING MEDICINES: * Bring a list of your current medicines when you go to the Emergency Department (ER). CARE ADVICE given per Urine, Blood In (Adult) guideline. Comments User: Suezanne Jacquet, RN Date/Time Eilene Ghazi Time): 10/21/2022 1:04:00 PM Caller refuses outcome Go to ED Now; states she understands the recommendation and the reason for it, just doesn't want to do it. Prefers to be seen in office. Nurse called backline per directives and provided patient's name, DOB, reason for  calling, outcome, and call back number; person answering states someone will give her a  call. Referrals GO TO FACILITY REFUSE

## 2022-10-21 NOTE — Telephone Encounter (Signed)
Received call from access nurse patient was advised to go to ED but refused. Patient does have office visit tomorrow with [place].

## 2022-10-21 NOTE — Telephone Encounter (Signed)
Patient called in stating she has blood in her urine and experiencing some uncomfortableness while sitting and some pain when urinating. She is scheduled with [place] tomorrow. Sent over to access nurse.

## 2022-10-21 NOTE — Telephone Encounter (Signed)
Noted  

## 2022-10-22 ENCOUNTER — Encounter: Payer: Self-pay | Admitting: Family

## 2022-10-22 ENCOUNTER — Ambulatory Visit: Payer: Medicare PPO | Admitting: Family

## 2022-10-22 VITALS — BP 132/74 | HR 87 | Temp 98.0°F | Resp 16 | Ht 64.0 in | Wt 130.0 lb

## 2022-10-22 DIAGNOSIS — Z23 Encounter for immunization: Secondary | ICD-10-CM | POA: Diagnosis not present

## 2022-10-22 DIAGNOSIS — R319 Hematuria, unspecified: Secondary | ICD-10-CM | POA: Insufficient documentation

## 2022-10-22 DIAGNOSIS — N3001 Acute cystitis with hematuria: Secondary | ICD-10-CM

## 2022-10-22 LAB — POC URINALSYSI DIPSTICK (AUTOMATED)
Bilirubin, UA: NEGATIVE
Glucose, UA: NEGATIVE
Ketones, UA: NEGATIVE
Nitrite, UA: NEGATIVE
Protein, UA: NEGATIVE
Spec Grav, UA: <=1.005 — AB (ref 1.010–1.025)
Urobilinogen, UA: 0.2 E.U./dL
pH, UA: 7.5 (ref 5.0–8.0)

## 2022-10-22 MED ORDER — CIPROFLOXACIN HCL 500 MG PO TABS: 500.0000 mg | ORAL_TABLET | Freq: Two times a day (BID) | ORAL | 0 refills | Status: AC

## 2022-10-22 NOTE — Progress Notes (Signed)
Established Patient Office Visit  Subjective:  Patient ID: Kylie Acosta, female    DOB: Jun 18, 1946  Age: 76 y.o. MRN: [id number]  CC:  Chief Complaint  Patient presents with   Hematuria    Since yesterday    HPI Kylie Acosta is here today with concerns.   Yesterday am went to the bathroom, and after peeing noticed some blood in her urine. Urinary frequency started as well yesterday.  Denies fever or chills.  Denies back pain.   Has increased her water intake since symptoms started, and she started drinking cranberry juice.   Past Medical History:  Diagnosis Date   Allergy    allerlgic rhinitis   Hyperlipidemia    Osteopenia    Osteoporosis     Past Surgical History:  Procedure Laterality Date   ABDOMINAL HYSTERECTOMY  1997   fibroids   COLONOSCOPY     [place]/2005   HIP SURGERY  08/14/2019    Family History  Problem Relation Age of Onset   Colon cancer Paternal Aunt    Colon polyps Neg Hx    Esophageal cancer Neg Hx    Rectal cancer Neg Hx    Stomach cancer Neg Hx     Social History   Socioeconomic History   Marital status: Married    Spouse name: Not on file   Number of children: Not on file   Years of education: Not on file   Highest education level: Not on file  Occupational History   Not on file  Tobacco Use   Smoking status: Never   Smokeless tobacco: Never  Vaping Use   Vaping Use: Never used  Substance and Sexual Activity   Alcohol use: No    Alcohol/week: 0.0 standard drinks of alcohol   Drug use: No   Sexual activity: Never  Other Topics Concern   Not on file  Social History Narrative   Not on file   Social Determinants of Health   Financial Resource Strain: Low Risk  (01/11/2022)   Overall Financial Resource Strain (CARDIA)    Difficulty of Paying Living Expenses: Not hard at all  Food Insecurity: No Food Insecurity (01/11/2022)   Hunger Vital Sign    Worried About Running Out of Food in the Last Year: Never true    Ran Out  of Food in the Last Year: Never true  Transportation Needs: No Transportation Needs (01/11/2022)   PRAPARE - Hydrologist (Medical): No    Lack of Transportation (Non-Medical): No  Physical Activity: Unknown (01/11/2022)   Exercise Vital Sign    Days of Exercise per Week: Not on file    Minutes of Exercise per Session: 30 min  Stress: No Stress Concern Present (01/11/2022)   [place]    Feeling of Stress : Not at all  Social Connections: [place] (01/11/2022)   Social Connection and Isolation Panel [NHANES]    Frequency of Communication with Friends and Family: More than three times a week    Frequency of Social Gatherings with Friends and Family: More than three times a week    Attends Religious Services: More than 4 times per year    Active Member of [employer] or Organizations: Yes    Attends Archivist Meetings: More than 4 times per year    Marital Status: Married  Human resources officer Violence: Not At Risk (01/11/2022)   Humiliation, Afraid, Rape, and Kick questionnaire  Fear of Current or Ex-Partner: No    Emotionally Abused: No    Physically Abused: No    Sexually Abused: No    Outpatient Medications Prior to Visit  Medication Sig Dispense Refill   atovaquone-proguanil (MALARONE) 250-100 MG TABS tablet Take 1 tablet by mouth daily. As directed 2 days before travel and through 7 days after 45 tablet 0   Calcium Carbonate-Vit D-Min (CALCIUM 1200 PO) Take 1 capsule by mouth daily.     cholecalciferol (VITAMIN D) 1000 UNITS tablet Take 2,000 Units by mouth daily.      denosumab (PROLIA) 60 MG/ML SOSY injection Inject 60 mg into the skin every 6 (six) months.     desoximetasone (TOPICORT) 0.25 % cream Apply 1 application topically 2 (two) times daily. To eczema on hands 30 g 3   fluticasone (FLONASE) 50 MCG/ACT nasal spray SHAKE LIQUID AND USE 2 SPRAYS IN EACH NOSTRIL EVERY  DAY 48 g 0   meloxicam (MOBIC) 7.5 MG tablet TAKE 1 TABLET(7.5 MG) BY MOUTH DAILY WITH A MEAL AS NEEDED FOR PAIN 90 tablet 1   Multiple Vitamin (MULTIVITAMIN) capsule Take 1 capsule by mouth daily.     simvastatin (ZOCOR) 20 MG tablet TAKE 1 TABLET(20 MG) BY MOUTH DAILY 90 tablet 3   Facility-Administered Medications Prior to Visit  Medication Dose Route Frequency Provider Last Rate Last Admin   denosumab (PROLIA) injection 60 mg  60 mg Subcutaneous Once Bedsole, Amy E, MD        Allergies  Allergen Reactions   Benadryl [Diphenhydramine] Itching    Severe itching. Allergic reaction to cream.   Alendronate Sodium     REACTION: gerd   Atorvastatin     REACTION: muscle and joint pain   Famotidine     REACTION: not effective   Omeprazole     REACTION: not effective   Raloxifene     REACTION: hot flashes   Sulfonamide Derivatives     REACTION: whelps        Objective:    Physical Exam Vitals reviewed.  Constitutional:      General: She is not in acute distress.    Appearance: Normal appearance. She is well-developed and well-groomed. She is not ill-appearing, toxic-appearing or diaphoretic.  HENT:     Mouth/Throat:     Pharynx: No pharyngeal swelling.     Tonsils: No tonsillar exudate.  Neck:     Thyroid: No thyroid mass.  Pulmonary:     Effort: Pulmonary effort is normal.  Abdominal:     Tenderness: There is no abdominal tenderness.  Musculoskeletal:     Lumbar back: Normal. No tenderness.     Comments: No flank pain no CVA tenderness  Lymphadenopathy:     Cervical:     Right cervical: No superficial cervical adenopathy.    Left cervical: No superficial cervical adenopathy.  Neurological:     General: No focal deficit present.     Mental Status: She is alert and oriented to person, place, and time. Mental status is at baseline.  Psychiatric:        Mood and Affect: Mood normal.        Behavior: Behavior normal.        Thought Content: Thought content normal.         Judgment: Judgment normal.     BP 132/74   Pulse 87   Temp 98 F (36.7 C)   Resp 16   Ht '5\' 4"'$  (1.626 m)   Wt 130  lb (59 kg)   SpO2 99%   BMI 22.31 kg/m  Wt Readings from Last 3 Encounters:  10/22/22 130 lb (59 kg)  02/19/22 132 lb (59.9 kg)  01/11/22 126 lb (57.2 kg)     Health Maintenance Due  Topic Date Due   Zoster Vaccines- Shingrix (1 of 2) Never done   COVID-19 Vaccine (5 - Pfizer series) 02/01/2022   COLONOSCOPY (Pts 45-65yr Insurance coverage will need to be confirmed)  10/21/2022    There are no preventive care reminders to display for this patient.  Lab Results  Component Value Date   TSH 3.79 12/09/2021   Lab Results  Component Value Date   WBC 8.2 12/09/2021   HGB 13.0 12/09/2021   HCT 39.2 12/09/2021   MCV 96.2 12/09/2021   PLT 258.0 12/09/2021   Lab Results  Component Value Date   NA 138 07/09/2022   K 4.1 07/09/2022   CO2 30 07/09/2022   GLUCOSE 78 07/09/2022   BUN 12 07/09/2022   CREATININE 0.74 07/09/2022   BILITOT 0.5 12/09/2021   ALKPHOS 44 12/09/2021   AST 23 12/09/2021   ALT 17 12/09/2021   PROT 6.5 12/09/2021   ALBUMIN 4.3 12/09/2021   CALCIUM 9.4 07/09/2022   GFR 77.54 07/09/2022   No results found for: "HGBA1C"    Assessment & Plan:   Problem List Items Addressed This Visit       Genitourinary   Acute cystitis with hematuria    poct urine dip in office Urine culture ordered pending results antbx sent to pharmacy, pt to take as directed. Encouraged increased water intake throughout the day. Choosing to treat due to being symptomatic. If no improvement in the next 2 days pt advised to let me know.       Relevant Medications   ciprofloxacin (CIPRO) 500 MG tablet     Other   Encounter for vaccination - Primary   Relevant Orders   Flu Vaccine QUAD High Dose(Fluad) (Completed)   Hematuria   Relevant Orders   POCT Urinalysis Dipstick (Automated) (Completed)   Urine Culture    Meds ordered this encounter   Medications   ciprofloxacin (CIPRO) 500 MG tablet    Sig: Take 1 tablet (500 mg total) by mouth 2 (two) times daily for 3 days.    Dispense:  6 tablet    Refill:  0    Order Specific Question:   Supervising Provider    Answer:   BEDSOLE, AMY E [[id number]]    Follow-up: No follow-ups on file.    TEugenia Pancoast FNP

## 2022-10-22 NOTE — Progress Notes (Signed)
Pending results of urine culture.

## 2022-10-22 NOTE — Assessment & Plan Note (Signed)
poct urine dip in office Urine culture ordered pending results antbx sent to pharmacy, pt to take as directed. Encouraged increased water intake throughout the day. Choosing to treat due to being symptomatic. If no improvement in the next 2 days pt advised to let me know.  

## 2022-10-26 ENCOUNTER — Other Ambulatory Visit: Payer: Self-pay | Admitting: Family

## 2022-10-26 DIAGNOSIS — N3001 Acute cystitis with hematuria: Secondary | ICD-10-CM

## 2022-10-26 LAB — URINE CULTURE
MICRO NUMBER:: 14173448
SPECIMEN QUALITY:: ADEQUATE

## 2022-10-26 MED ORDER — NITROFURANTOIN MONOHYD MACRO 100 MG PO CAPS: 100.0000 mg | ORAL_CAPSULE | Freq: Two times a day (BID) | ORAL | 0 refills | Status: AC

## 2022-10-26 NOTE — Progress Notes (Signed)
You were found to be positive for a UTI.  Unfortunately the antbx that you were given (cipro) was found to be resistant .  I am sending in [place], start taking once picked up from pharmacy.

## 2022-10-27 ENCOUNTER — Telehealth: Payer: Self-pay

## 2022-10-27 NOTE — Telephone Encounter (Signed)
Called pt to go over urine culture results with her but her husband answered and stated she was not home and that he would have her give [place] a call back.

## 2022-11-08 ENCOUNTER — Encounter: Payer: Self-pay | Admitting: Family Medicine

## 2022-11-08 ENCOUNTER — Telehealth: Payer: Self-pay | Admitting: Family Medicine

## 2022-11-08 DIAGNOSIS — R944 Abnormal results of kidney function studies: Secondary | ICD-10-CM

## 2022-11-08 DIAGNOSIS — M810 Age-related osteoporosis without current pathological fracture: Secondary | ICD-10-CM

## 2022-11-08 DIAGNOSIS — E7800 Pure hypercholesterolemia, unspecified: Secondary | ICD-10-CM

## 2022-11-08 DIAGNOSIS — E559 Vitamin D deficiency, unspecified: Secondary | ICD-10-CM

## 2022-11-08 NOTE — Telephone Encounter (Signed)
I put the orders in  She will need to mention to the lab tech that she is doing all of the labs, not just the bmet

## 2022-11-08 NOTE — Telephone Encounter (Signed)
Patient scheduled.

## 2022-11-08 NOTE — Telephone Encounter (Signed)
I put in order for bmet  Please schedule non fasting labs

## 2022-11-08 NOTE — Telephone Encounter (Signed)
Patient is scheduled prolia lab on January 2nd, she would like to know if orders for her cpe labs can be placed so that she can receive them all at once instead of coming in for more labs in March when she comes in for her Annual Wellness visit?

## 2022-11-08 NOTE — Telephone Encounter (Signed)
Pt called asking could lab orders for prolia be sent in? Pt stated she last had prolia labs done in June 2023 & was suppose to return in 6 months. Call back # [phone]

## 2022-11-09 ENCOUNTER — Encounter: Payer: Self-pay | Admitting: Internal Medicine

## 2022-11-09 NOTE — Telephone Encounter (Signed)
Note on lab appt says bmet and CPE labs so tech will be aware

## 2022-11-11 NOTE — Telephone Encounter (Signed)
ERROR

## 2022-11-16 ENCOUNTER — Encounter: Payer: Self-pay | Admitting: Family Medicine

## 2022-11-16 ENCOUNTER — Telehealth: Payer: Self-pay

## 2022-11-16 ENCOUNTER — Other Ambulatory Visit (HOSPITAL_COMMUNITY): Payer: Self-pay

## 2022-11-16 ENCOUNTER — Ambulatory Visit: Payer: Medicare PPO | Admitting: Family Medicine

## 2022-11-16 VITALS — BP 120/80 | HR 74 | Temp 97.7°F | Ht 64.0 in | Wt 130.1 lb

## 2022-11-16 DIAGNOSIS — N760 Acute vaginitis: Secondary | ICD-10-CM | POA: Diagnosis not present

## 2022-11-16 LAB — POCT WET PREP (WET MOUNT): Trichomonas Wet Prep HPF POC: ABSENT

## 2022-11-16 MED ORDER — FLUCONAZOLE 150 MG PO TABS: ORAL_TABLET | ORAL | 0 refills | Status: DC

## 2022-11-16 MED ORDER — DESOXIMETASONE 0.25 % EX CREA: 1.0000 | TOPICAL_CREAM | Freq: Two times a day (BID) | CUTANEOUS | 3 refills | Status: AC

## 2022-11-16 NOTE — Progress Notes (Signed)
Subjective:    Patient ID: Kylie Acosta, female    DOB: Feb 20, 1946, 76 y.o.   MRN: [id number]  HPI Pt presents for vaginal symptoms   Wt Readings from Last 3 Encounters:  11/16/22 130 lb 2 oz (59 kg)  10/22/22 130 lb (59 kg)  02/19/22 132 lb (59.9 kg)   22.34 kg/m  Symptoms started 10/29/22  Woke up with uti symptoms with blood in urine  She saw NP - had pos ua and tx , was tx with cipro   3 d later developed symptoms of a vaginal yeast infection  She bought monistat and it helped but the cream was irritating as well  Had to stop cream but used the inserts   Improved but not gone  Tingling/burning and itching -worse at night  Used cold compress to help her sleep   No discharge  No cramping  Uti is better   Results for orders placed or performed in visit on 11/16/22  POCT Wet Prep [employer] [facility])  Result Value Ref Range   Source Wet Prep POC vaginal    WBC, Wet Prep HPF POC few    Bacteria Wet Prep HPF POC Few Few   BACTERIA WET PREP MORPHOLOGY POC     Clue Cells Wet Prep HPF POC None None   Clue Cells Wet Prep [place] POC     Yeast Wet Prep HPF POC Moderate (A) None   KOH Wet Prep POC Moderate (A) None   Trichomonas Wet Prep HPF POC Absent Absent    Patient Active Problem List   Diagnosis Date Noted   Vaginitis 11/16/2022   Hematuria 10/22/2022   Acute cystitis with hematuria 10/22/2022   Abnormal urinalysis 07/26/2022   Decreased GFR 06/04/2022   Encounter for vaccination 02/19/2022   Stress reaction 06/29/2019   Estrogen deficiency 07/24/2018   Hx of adenomatous colonic polyps 10/26/2017   Vitamin D deficiency 07/30/2015   GERD 07/07/2010   VAGINITIS, ATROPHIC 10/15/2009   Osteoporosis 07/19/2007   Hyperlipidemia 07/14/2007   ALLERGIC RHINITIS 07/14/2007   Eczema of hand 07/14/2007   Past Medical History:  Diagnosis Date   Allergy    allerlgic rhinitis   Hx of adenomatous colonic polyps    Hyperlipidemia    Osteopenia    Osteoporosis    Past  Surgical History:  Procedure Laterality Date   ABDOMINAL HYSTERECTOMY  1997   fibroids   COLONOSCOPY     [place]/2005   HIP SURGERY  08/14/2019   Social History   Tobacco Use   Smoking status: Never   Smokeless tobacco: Never  Vaping Use   Vaping Use: Never used  Substance Use Topics   Alcohol use: No    Alcohol/week: 0.0 standard drinks of alcohol   Drug use: No   Family History  Problem Relation Age of Onset   Colon cancer Paternal Aunt    Colon polyps Neg Hx    Esophageal cancer Neg Hx    Rectal cancer Neg Hx    Stomach cancer Neg Hx    Allergies  Allergen Reactions   Benadryl [Diphenhydramine] Itching    Severe itching. Allergic reaction to cream.   Alendronate Sodium     REACTION: gerd   Atorvastatin     REACTION: muscle and joint pain   Famotidine     REACTION: not effective   Omeprazole     REACTION: not effective   Raloxifene     REACTION: hot flashes   Sulfonamide Derivatives  REACTION: whelps   Current Outpatient Medications on File Prior to Visit  Medication Sig Dispense Refill   Calcium Carbonate-Vit D-Min (CALCIUM 1200 PO) Take 1 capsule by mouth daily.     cholecalciferol (VITAMIN D) 1000 UNITS tablet Take 2,000 Units by mouth daily.      denosumab (PROLIA) 60 MG/ML SOSY injection Inject 60 mg into the skin every 6 (six) months.     fluticasone (FLONASE) 50 MCG/ACT nasal spray SHAKE LIQUID AND USE 2 SPRAYS IN EACH NOSTRIL EVERY DAY 48 g 0   Multiple Vitamin (MULTIVITAMIN) capsule Take 1 capsule by mouth daily.     simvastatin (ZOCOR) 20 MG tablet TAKE 1 TABLET(20 MG) BY MOUTH DAILY 90 tablet 3   Current Facility-Administered Medications on File Prior to Visit  Medication Dose Route Frequency Provider Last Rate Last Admin   denosumab (PROLIA) injection 60 mg  60 mg Subcutaneous Once Bedsole, Amy E, MD          Review of Systems  Constitutional:  Negative for activity change, appetite change, fatigue, fever and unexpected weight change.   HENT:  Negative for congestion, rhinorrhea, sore throat and trouble swallowing.   Eyes:  Negative for pain, redness, itching and visual disturbance.  Respiratory:  Negative for cough, chest tightness, shortness of breath and wheezing.   Cardiovascular:  Negative for chest pain and palpitations.  Gastrointestinal:  Negative for abdominal pain, blood in stool, constipation, diarrhea and nausea.  Endocrine: Negative for cold intolerance, heat intolerance, polydipsia and polyuria.  Genitourinary:  Positive for vaginal pain. Negative for difficulty urinating, dysuria, frequency and urgency.  Musculoskeletal:  Negative for arthralgias, joint swelling and myalgias.  Skin:  Negative for pallor and rash.  Neurological:  Negative for dizziness, tremors, weakness, numbness and headaches.  Hematological:  Negative for adenopathy. Does not bruise/bleed easily.  Psychiatric/Behavioral:  Negative for decreased concentration and dysphoric mood. The patient is not nervous/anxious.        Objective:   Physical Exam Constitutional:      General: She is not in acute distress.    Appearance: Normal appearance. She is normal weight. She is not ill-appearing.  Eyes:     General:        Right eye: No discharge.        Left eye: No discharge.     Conjunctiva/sclera: Conjunctivae normal.     Pupils: Pupils are equal, round, and reactive to light.  Cardiovascular:     Rate and Rhythm: Normal rate and regular rhythm.     Heart sounds: Normal heart sounds.  Pulmonary:     Effort: Pulmonary effort is normal.     Breath sounds: Normal breath sounds.  Genitourinary:    Comments: Mildly hyperemic vaginal mucosa No excessive discharge No lesions No tenderness Musculoskeletal:     Cervical back: Neck supple.  Lymphadenopathy:     Cervical: No cervical adenopathy.  Skin:    General: Skin is warm and dry.     Findings: No rash.  Neurological:     Mental Status: She is alert.  Psychiatric:        Mood and  Affect: Mood normal.           Assessment & Plan:   Problem List Items Addressed This Visit       Genitourinary   Vaginitis - Primary    S/p tx with abx, yeast on wet prep  Some imp with otc montistat but did not tol well  Px diflucan 150 mg now  and repeat dose in 3 d Consider probiotic /food with probiotic Keep area clean/ dry well and avoid tight clothing Update if not starting to improve in a week or if worsening        Relevant Medications   desoximetasone (TOPICORT) 0.25 % cream   Other Relevant Orders   POCT Wet Prep [facility]) (Completed)

## 2022-11-16 NOTE — Telephone Encounter (Signed)
Pharmacy Patient Advocate Encounter  Prolia due 12/14/2022  Insurance verification completed.    The patient is insured through [employer] test claims for: Prolia.  Pharmacy benefit copay: $64.00

## 2022-11-16 NOTE — Assessment & Plan Note (Signed)
S/p tx with abx, yeast on wet prep  Some imp with otc montistat but did not tol well  Px diflucan 150 mg now and repeat dose in 3 d Consider probiotic /food with probiotic Keep area clean/ dry well and avoid tight clothing Update if not starting to improve in a week or if worsening

## 2022-11-16 NOTE — Patient Instructions (Addendum)
Eat yogurt or try probiotics   Take diflucan pill today and again in 3 days (the days you take those pills hold the simvastatin)  This is for yeast  If symptoms do not improve then let us know   Keep the vaginal area dry for now

## 2022-11-30 NOTE — Telephone Encounter (Signed)
Do we need to send reminder to re run after first of year?

## 2022-12-03 NOTE — Telephone Encounter (Signed)
No, we can set a reminder and it will pop back up for [place] after the beginning of the year

## 2022-12-14 ENCOUNTER — Other Ambulatory Visit (INDEPENDENT_AMBULATORY_CARE_PROVIDER_SITE_OTHER): Payer: Medicare PPO

## 2022-12-14 DIAGNOSIS — E7800 Pure hypercholesterolemia, unspecified: Secondary | ICD-10-CM | POA: Diagnosis not present

## 2022-12-14 DIAGNOSIS — M810 Age-related osteoporosis without current pathological fracture: Secondary | ICD-10-CM

## 2022-12-14 DIAGNOSIS — R944 Abnormal results of kidney function studies: Secondary | ICD-10-CM | POA: Diagnosis not present

## 2022-12-14 DIAGNOSIS — E559 Vitamin D deficiency, unspecified: Secondary | ICD-10-CM | POA: Diagnosis not present

## 2022-12-14 LAB — BASIC METABOLIC PANEL
BUN: 16 mg/dL (ref 6–23)
CO2: 29 mEq/L (ref 19–32)
Calcium: 9.6 mg/dL (ref 8.4–10.5)
Chloride: 103 mEq/L (ref 96–112)
Creatinine, Ser: 0.74 mg/dL (ref 0.40–1.20)
GFR: 77.31 mL/min (ref >60.00)
Glucose, Bld: 91 mg/dL (ref 70–99)
Potassium: 4.8 mEq/L (ref 3.5–5.1)
Sodium: 143 mEq/L (ref 135–145)

## 2022-12-14 LAB — LIPID PANEL
Cholesterol: 195 mg/dL (ref 0–200)
HDL: 77.5 mg/dL (ref >39.00)
LDL Cholesterol: 100 mg/dL — ABNORMAL HIGH (ref 0–99)
NonHDL: 117.14
Total CHOL/HDL Ratio: 3
Triglycerides: 84 mg/dL (ref 0.0–149.0)
VLDL: 16.8 mg/dL (ref 0.0–40.0)

## 2022-12-14 LAB — CBC WITH DIFFERENTIAL/PLATELET
Basophils Absolute: 0.1 10*3/uL (ref 0.0–0.1)
Basophils Relative: 0.9 % (ref 0.0–3.0)
Eosinophils Absolute: 0.2 10*3/uL (ref 0.0–0.7)
Eosinophils Relative: 4.2 % (ref 0.0–5.0)
HCT: 40.5 % (ref 36.0–46.0)
Hemoglobin: 14 g/dL (ref 12.0–15.0)
Lymphocytes Relative: 29.1 % (ref 12.0–46.0)
Lymphs Abs: 1.6 10*3/uL (ref 0.7–4.0)
MCHC: 34.5 g/dL (ref 30.0–36.0)
MCV: 95.2 fl (ref 78.0–100.0)
Monocytes Absolute: 0.5 10*3/uL (ref 0.1–1.0)
Monocytes Relative: 9.2 % (ref 3.0–12.0)
Neutro Abs: 3.1 10*3/uL (ref 1.4–7.7)
Neutrophils Relative %: 56.6 % (ref 43.0–77.0)
Platelets: 290 10*3/uL (ref 150.0–400.0)
RBC: 4.26 Mil/uL (ref 3.87–5.11)
RDW: 12.3 % (ref 11.5–15.5)
WBC: 5.5 10*3/uL (ref 4.0–10.5)

## 2022-12-14 LAB — HEPATIC FUNCTION PANEL
ALT: 21 U/L (ref 0–35)
AST: 24 U/L (ref 0–37)
Albumin: 4.5 g/dL (ref 3.5–5.2)
Alkaline Phosphatase: 43 U/L (ref 39–117)
Bilirubin, Direct: 0.1 mg/dL (ref 0.0–0.3)
Total Bilirubin: 0.8 mg/dL (ref 0.2–1.2)
Total Protein: 6.8 g/dL (ref 6.0–8.3)

## 2022-12-14 LAB — VITAMIN D 25 HYDROXY (VIT D DEFICIENCY, FRACTURES): VITD: 67.09 ng/mL (ref 30.00–100.00)

## 2022-12-14 NOTE — Addendum Note (Signed)
Addended by: Tammi Sou on: 12/14/2022 11:51 AM   Modules accepted: Orders

## 2022-12-15 ENCOUNTER — Encounter: Payer: Self-pay | Admitting: *Deleted

## 2022-12-16 ENCOUNTER — Other Ambulatory Visit (HOSPITAL_COMMUNITY): Payer: Self-pay

## 2022-12-16 NOTE — Telephone Encounter (Signed)
Test claim for Prolia still shows $64.00 copay.

## 2022-12-28 ENCOUNTER — Ambulatory Visit (INDEPENDENT_AMBULATORY_CARE_PROVIDER_SITE_OTHER): Payer: Medicare PPO

## 2022-12-28 DIAGNOSIS — M810 Age-related osteoporosis without current pathological fracture: Secondary | ICD-10-CM | POA: Diagnosis not present

## 2022-12-28 MED ORDER — DENOSUMAB 60 MG/ML ~~LOC~~ SOSY
60.0000 mg | PREFILLED_SYRINGE | Freq: Once | SUBCUTANEOUS | Status: AC
  Administered 2022-12-28: 60 mg via SUBCUTANEOUS

## 2022-12-28 NOTE — Progress Notes (Signed)
Per orders of Dr. Loura Pardon, injection of prolia 60 mg Jerome in right arm given by Ozzie Hoyle. Patient tolerated injection well.

## 2022-12-30 NOTE — Telephone Encounter (Signed)
Patient has received prolia added to spread sheet

## 2022-12-30 NOTE — Telephone Encounter (Signed)
Left message to return call to our office.  

## 2023-01-03 ENCOUNTER — Other Ambulatory Visit: Payer: Self-pay | Admitting: Family Medicine

## 2023-01-26 DIAGNOSIS — Z1231 Encounter for screening mammogram for malignant neoplasm of breast: Secondary | ICD-10-CM | POA: Diagnosis not present

## 2023-01-26 LAB — HM MAMMOGRAPHY

## 2023-02-10 ENCOUNTER — Encounter: Payer: Self-pay | Admitting: Internal Medicine

## 2023-02-22 ENCOUNTER — Ambulatory Visit (INDEPENDENT_AMBULATORY_CARE_PROVIDER_SITE_OTHER): Payer: Medicare PPO

## 2023-02-22 ENCOUNTER — Ambulatory Visit (INDEPENDENT_AMBULATORY_CARE_PROVIDER_SITE_OTHER): Payer: Medicare PPO | Admitting: Family Medicine

## 2023-02-22 ENCOUNTER — Encounter: Payer: Self-pay | Admitting: Family Medicine

## 2023-02-22 VITALS — Ht 64.0 in | Wt 135.0 lb

## 2023-02-22 VITALS — BP 124/64 | HR 78 | Temp 98.0°F | Ht 63.5 in | Wt 131.5 lb

## 2023-02-22 DIAGNOSIS — K219 Gastro-esophageal reflux disease without esophagitis: Secondary | ICD-10-CM

## 2023-02-22 DIAGNOSIS — M810 Age-related osteoporosis without current pathological fracture: Secondary | ICD-10-CM

## 2023-02-22 DIAGNOSIS — Z0000 Encounter for general adult medical examination without abnormal findings: Secondary | ICD-10-CM

## 2023-02-22 DIAGNOSIS — E559 Vitamin D deficiency, unspecified: Secondary | ICD-10-CM | POA: Diagnosis not present

## 2023-02-22 DIAGNOSIS — Z86010 Personal history of colonic polyps: Secondary | ICD-10-CM | POA: Diagnosis not present

## 2023-02-22 DIAGNOSIS — E7800 Pure hypercholesterolemia, unspecified: Secondary | ICD-10-CM

## 2023-02-22 DIAGNOSIS — E2839 Other primary ovarian failure: Secondary | ICD-10-CM

## 2023-02-22 MED ORDER — SIMVASTATIN 20 MG PO TABS: ORAL_TABLET | ORAL | 3 refills | Status: DC

## 2023-02-22 NOTE — Assessment & Plan Note (Signed)
Vitamin D level is therapeutic with current supplementation Disc importance of this to bone and overall health Level of 67

## 2023-02-22 NOTE — Assessment & Plan Note (Signed)
Dexa ordered Pt will call [facility] to schedule One fall no fx Disc fall prevention  Continue vit D Add strength training if possible

## 2023-02-22 NOTE — Patient Instructions (Addendum)
If you are interested in the new shingles vaccine (Shingrix) - call your local pharmacy to check on coverage and availability  If affordable, get on a wait list at your pharmacy to get the vaccine.  Call the GI clinic to schedule colonoscopy   [facility]  [phone]  Call [facility] to schedule your bone density test  I put the order in   Consider some strength training with exercise bands or light weights or go to the gym  Keep walking   Be mindful of cholesterol in diet  Avoid red meat/ fried foods/ egg yolks/ fatty breakfast meats/ butter, cheese and high fat dairy/ and shellfish    Watch out for the foods that flare your acid reflux  Tums are ok  Pepcid over the counter is ok also  Let us know if things are not improving
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
## 2023-02-22 NOTE — Assessment & Plan Note (Signed)
Disc goals for lipids and reasons to control them Rev last labs with pt Rev low sat fat diet in detail LDL up to 100 but HDL is also improved into 70s Simvastatin 20 mg - will continue this and watch diet
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
## 2023-02-22 NOTE — Assessment & Plan Note (Signed)
Reviewed health habits including diet and exercise and skin cancer prevention Reviewed appropriate screening tests for age  Also reviewed health mt list, fam hx and immunization status , as well as social and family history   See HPI Labs reviewed  Disc shingrix vaccine  Colonoscopy /GI ref done for recall  Mammogram utd 01/2023 Dexa ordered -pt will schedule , disc fall prevention and vit D intake and exercise

## 2023-02-22 NOTE — Assessment & Plan Note (Signed)
Some tums recently  Enc her to watch diet for foods that flare Pepcid otc prn  Update if no improvement

## 2023-02-22 NOTE — Assessment & Plan Note (Signed)
GI referral done for 5 y recall colonoscopy  She will call to schedule

## 2023-02-22 NOTE — Patient Instructions (Signed)
Kylie Acosta , Thank you for taking time to come for your Medicare Wellness Visit. I appreciate your ongoing commitment to your health goals. Please review the following plan we discussed and let me know if I can assist you in the future.   These are the goals we discussed:  Goals      Increase physical activity     Starting 08/18/2018, I will continue to walk at least 30 minutes daily.      Patient Stated     10/29/2019, I will continue to walk and ride my exercise bike at least 6 miles a day.      Patient Stated     Would like to maintain current routine.      Patient Stated     Continue walking with dog, and learn to make yogurt.        This is a list of the screening recommended for you and due dates:  Health Maintenance  Topic Date Due   Zoster (Shingles) Vaccine (1 of 2) Never done   COVID-19 Vaccine (5 - 2023-24 season) 08/13/2022   Colon Cancer Screening  10/21/2022   DTaP/Tdap/Td vaccine (3 - Tdap) 04/07/2023   Mammogram  01/27/2024   Medicare Annual Wellness Visit  02/22/2024   Pneumonia Vaccine  Completed   Flu Shot  Completed   DEXA scan (bone density measurement)  Completed   Hepatitis C Screening: USPSTF Recommendation to screen - Ages 18-79 yo.  Completed   HPV Vaccine  Aged Out    Advanced directives: copy is in chart.  Conditions/risks identified: none  Next appointment: Follow up in one year for your annual wellness visit 02/27/2024 @ 10:45 via telephone.   Preventive Care 64 Years and Older, Female Preventive care refers to lifestyle choices and visits with your health care provider that can promote health and wellness. What does preventive care include? A yearly physical exam. This is also called an annual well check. Dental exams once or twice a year. Routine eye exams. Ask your health care provider how often you should have your eyes checked. Personal lifestyle choices, including: Daily care of your teeth and gums. Regular physical  activity. Eating a healthy diet. Avoiding tobacco and drug use. Limiting alcohol use. Practicing safe sex. Taking low-dose aspirin every day. Taking vitamin and mineral supplements as recommended by your health care provider. What happens during an annual well check? The services and screenings done by your health care provider during your annual well check will depend on your age, overall health, lifestyle risk factors, and family history of disease. Counseling  Your health care provider may ask you questions about your: Alcohol use. Tobacco use. Drug use. Emotional well-being. Home and relationship well-being. Sexual activity. Eating habits. History of falls. Memory and ability to understand (cognition). Work and work Statistician. Reproductive health. Screening  You may have the following tests or measurements: Height, weight, and BMI. Blood pressure. Lipid and cholesterol levels. These may be checked every 5 years, or more frequently if you are over 54 years old. Skin check. Lung cancer screening. You may have this screening every year starting at age 5 if you have a 30-pack-year history of smoking and currently smoke or have quit within the past 15 years. Fecal occult blood test (FOBT) of the stool. You may have this test every year starting at age 17. Flexible sigmoidoscopy or colonoscopy. You may have a sigmoidoscopy every 5 years or a colonoscopy every 10 years starting at age 11. Hepatitis C  blood test. Hepatitis B blood test. Sexually transmitted disease (STD) testing. Diabetes screening. This is done by checking your blood sugar (glucose) after you have not eaten for a while (fasting). You may have this done every 1-3 years. Bone density scan. This is done to screen for osteoporosis. You may have this done starting at age 28. Mammogram. This may be done every 1-2 years. Talk to your health care provider about how often you should have regular mammograms. Talk with your  health care provider about your test results, treatment options, and if necessary, the need for more tests. Vaccines  Your health care provider may recommend certain vaccines, such as: Influenza vaccine. This is recommended every year. Tetanus, diphtheria, and acellular pertussis (Tdap, Td) vaccine. You may need a Td booster every 10 years. Zoster vaccine. You may need this after age 56. Pneumococcal 13-valent conjugate (PCV13) vaccine. One dose is recommended after age 37. Pneumococcal polysaccharide (PPSV23) vaccine. One dose is recommended after age 79. Talk to your health care provider about which screenings and vaccines you need and how often you need them. This information is not intended to replace advice given to you by your health care provider. Make sure you discuss any questions you have with your health care provider. Document Released: 12/26/2015 Document Revised: 08/18/2016 Document Reviewed: 09/30/2015 Elsevier Interactive Patient Education  2017 [place] Prevention in the Home Falls can cause injuries. They can happen to people of all ages. There are many things you can do to make your home safe and to help prevent falls. What can I do on the outside of my home? Regularly fix the edges of walkways and driveways and fix any cracks. Remove anything that might make you trip as you walk through a door, such as a raised step or threshold. Trim any bushes or trees on the path to your home. Use bright outdoor lighting. Clear any walking paths of anything that might make someone trip, such as rocks or tools. Regularly check to see if handrails are loose or broken. Make sure that both sides of any steps have handrails. Any raised decks and porches should have guardrails on the edges. Have any leaves, snow, or ice cleared regularly. Use sand or salt on walking paths during winter. Clean up any spills in your garage right away. This includes oil or grease spills. What can I  do in the bathroom? Use night lights. Install grab bars by the toilet and in the tub and shower. Do not use towel bars as grab bars. Use non-skid mats or decals in the tub or shower. If you need to sit down in the shower, use a plastic, non-slip stool. Keep the floor dry. Clean up any water that spills on the floor as soon as it happens. Remove soap buildup in the tub or shower regularly. Attach bath mats securely with double-sided non-slip rug tape. Do not have throw rugs and other things on the floor that can make you trip. What can I do in the bedroom? Use night lights. Make sure that you have a light by your bed that is easy to reach. Do not use any sheets or blankets that are too big for your bed. They should not hang down onto the floor. Have a firm chair that has side arms. You can use this for support while you get dressed. Do not have throw rugs and other things on the floor that can make you trip. What can I do in the kitchen?  Clean up any spills right away. Avoid walking on wet floors. Keep items that you use a lot in easy-to-reach places. If you need to reach something above you, use a strong step stool that has a grab bar. Keep electrical cords out of the way. Do not use floor polish or wax that makes floors slippery. If you must use wax, use non-skid floor wax. Do not have throw rugs and other things on the floor that can make you trip. What can I do with my stairs? Do not leave any items on the stairs. Make sure that there are handrails on both sides of the stairs and use them. Fix handrails that are broken or loose. Make sure that handrails are as long as the stairways. Check any carpeting to make sure that it is firmly attached to the stairs. Fix any carpet that is loose or worn. Avoid having throw rugs at the top or bottom of the stairs. If you do have throw rugs, attach them to the floor with carpet tape. Make sure that you have a light switch at the top of the stairs  and the bottom of the stairs. If you do not have them, ask someone to add them for you. What else can I do to help prevent falls? Wear shoes that: Do not have high heels. Have rubber bottoms. Are comfortable and fit you well. Are closed at the toe. Do not wear sandals. If you use a stepladder: Make sure that it is fully opened. Do not climb a closed stepladder. Make sure that both sides of the stepladder are locked into place. Ask someone to hold it for you, if possible. Clearly mark and make sure that you can see: Any grab bars or handrails. First and last steps. Where the edge of each step is. Use tools that help you move around (mobility aids) if they are needed. These include: Canes. Walkers. Scooters. Crutches. Turn on the lights when you go into a dark area. Replace any light bulbs as soon as they burn out. Set up your furniture so you have a clear path. Avoid moving your furniture around. If any of your floors are uneven, fix them. If there are any pets around you, be aware of where they are. Review your medicines with your doctor. Some medicines can make you feel dizzy. This can increase your chance of falling. Ask your doctor what other things that you can do to help prevent falls. This information is not intended to replace advice given to you by your health care provider. Make sure you discuss any questions you have with your health care provider. Document Released: 09/25/2009 Document Revised: 05/06/2016 Document Reviewed: 01/03/2015 Elsevier Interactive Patient Education  2017 [employer].

## 2023-02-28 ENCOUNTER — Telehealth: Payer: Self-pay | Admitting: Internal Medicine

## 2023-02-28 NOTE — Telephone Encounter (Signed)
Good morning Dr. Carlean Purl,  We received a call from this patient to schedule her five year recall colonoscopy. However, this patient is over the age of 13, would you like to seen them in the office first, or is it okay to schedule a colonoscopy directly. Please advise.    Thank you.

## 2023-02-28 NOTE — Telephone Encounter (Signed)
OK to schedule direct Does not need office visit

## 2023-03-01 DIAGNOSIS — M8589 Other specified disorders of bone density and structure, multiple sites: Secondary | ICD-10-CM | POA: Diagnosis not present

## 2023-03-01 LAB — HM DEXA SCAN

## 2023-03-02 ENCOUNTER — Encounter: Payer: Self-pay | Admitting: Internal Medicine

## 2023-03-02 ENCOUNTER — Ambulatory Visit (AMBULATORY_SURGERY_CENTER): Payer: Medicare PPO | Admitting: *Deleted

## 2023-03-02 VITALS — Ht 65.5 in | Wt 132.0 lb

## 2023-03-02 DIAGNOSIS — Z86010 Personal history of colonic polyps: Secondary | ICD-10-CM

## 2023-03-02 NOTE — Progress Notes (Signed)
Pt's previsit is done over the phone and all paperwork (prep instructions) sent to patient. Pt's name and DOB verified at the beginning of the previsit. Pt denies any difficulty with ambulating.  No egg or soy allergy known to patient  No issues known to pt with past sedation with any surgeries or procedures Patient denies issues being intubated No FH of Malignant Hyperthermia Pt is not on diet pills Pt is not on  home 02  Pt is not on blood thinners  Pt denies issues with constipation  Pt is not on dialysis Pt denies any upcoming cardiac testing Pt encouraged to use to use Singlecare or Goodrx to reduce cost  Patient's chart reviewed by Osvaldo Angst CNRA prior to previsit and patient appropriate for the [facility].  Previsit completed and red dot placed by patient's name on their procedure day (on provider's schedule).  . Visit by phone Pt states her weight is 132lb Instructions reviewed with pt and pt states understanding. Instructed to review again prior to procedure. Pt states they will.  Instructions sent by mail with coupon and by my chart

## 2023-03-04 ENCOUNTER — Other Ambulatory Visit: Payer: Self-pay

## 2023-03-04 MED ORDER — ZOSTER VAC RECOMB ADJUVANTED 50 MCG/0.5ML IM SUSR
0.5000 mL | Freq: Once | INTRAMUSCULAR | 1 refills | Status: AC
  Filled 2023-03-04 – 2023-04-05 (×3): qty 0.5, 1d supply, fill #0
  Filled 2023-10-14 (×2): qty 0.5, 1d supply, fill #1

## 2023-03-08 ENCOUNTER — Other Ambulatory Visit: Payer: Self-pay

## 2023-03-10 ENCOUNTER — Other Ambulatory Visit: Payer: Self-pay

## 2023-03-29 ENCOUNTER — Encounter: Payer: Self-pay | Admitting: Certified Registered Nurse Anesthetist

## 2023-03-30 ENCOUNTER — Encounter: Payer: Self-pay | Admitting: Internal Medicine

## 2023-03-30 ENCOUNTER — Ambulatory Visit (AMBULATORY_SURGERY_CENTER): Payer: Medicare PPO | Admitting: Internal Medicine

## 2023-03-30 VITALS — BP 119/59 | HR 66 | Temp 97.5°F | Resp 9 | Ht 63.0 in | Wt 132.0 lb

## 2023-03-30 DIAGNOSIS — Z86010 Personal history of colonic polyps: Secondary | ICD-10-CM

## 2023-03-30 DIAGNOSIS — D122 Benign neoplasm of ascending colon: Secondary | ICD-10-CM

## 2023-03-30 DIAGNOSIS — Z09 Encounter for follow-up examination after completed treatment for conditions other than malignant neoplasm: Secondary | ICD-10-CM

## 2023-03-30 DIAGNOSIS — E785 Hyperlipidemia, unspecified: Secondary | ICD-10-CM | POA: Diagnosis not present

## 2023-03-30 DIAGNOSIS — D123 Benign neoplasm of transverse colon: Secondary | ICD-10-CM | POA: Diagnosis not present

## 2023-03-30 DIAGNOSIS — K635 Polyp of colon: Secondary | ICD-10-CM | POA: Diagnosis not present

## 2023-03-30 MED ORDER — SODIUM CHLORIDE 0.9 % IV SOLN: 500.0000 mL | Freq: Once | INTRAVENOUS | Status: DC

## 2023-03-30 NOTE — Progress Notes (Signed)
Called to room to assist during endoscopic procedure.  Patient ID and intended procedure confirmed with present staff. Received instructions for my participation in the procedure from the performing physician.  

## 2023-03-30 NOTE — Progress Notes (Signed)
Report given to PACU, vss 

## 2023-03-30 NOTE — Op Note (Signed)
[facility] Patient Name: Kylie Acosta Procedure Date: 03/30/2023 10:04 AM MRN: [id number] Endoscopist: Iva Boop , MD, [id number] Age: 77 Referring MD:  Date of Birth: [birth year] Gender: Female Account #: [id number] Procedure:                Colonoscopy Indications:              Surveillance: Personal history of adenomatous                            polyps on last colonoscopy > 5 years ago, Last                            colonoscopy: November 2018 Medicines:                Monitored Anesthesia Care Procedure:                Pre-Anesthesia Assessment:                           - Prior to the procedure, a History and Physical                            was performed, and patient medications and                            allergies were reviewed. The patient's tolerance of                            previous anesthesia was also reviewed. The risks                            and benefits of the procedure and the sedation                            options and risks were discussed with the patient.                            All questions were answered, and informed consent                            was obtained. Prior Anticoagulants: The patient has                            taken no anticoagulant or antiplatelet agents. ASA                            Grade Assessment: II - A patient with mild systemic                            disease. After reviewing the risks and benefits,                            the patient was deemed in satisfactory condition to  undergo the procedure.                           After obtaining informed consent, the colonoscope                            was passed under direct vision. Throughout the                            procedure, the patient's blood pressure, pulse, and                            oxygen saturations were monitored continuously. The                            [id number]-HQ190L Colonoscope [id number] was  introduced                            through the anus and advanced to the the cecum,                            identified by appendiceal orifice and ileocecal                            valve. The colonoscopy was somewhat difficult due                            to significant looping. Successful completion of                            the procedure was aided by straightening and                            shortening the scope to obtain bowel loop reduction                            and applying abdominal pressure. The patient                            tolerated the procedure well. The quality of the                            bowel preparation was good. The ileocecal valve,                            appendiceal orifice, and rectum were photographed. Scope In: 10:09:30 AM Scope Out: 10:43:52 AM Scope Withdrawal Time: 0 hours 30 minutes 10 seconds  Total Procedure Duration: 0 hours 34 minutes 22 seconds  Findings:                 The perianal and digital rectal examinations were                            normal.  Six flat and sessile polyps were found in the                            transverse colon and ascending colon. The polyps                            were diminutive in size. These polyps were removed                            with a cold snare. Resection and retrieval were                            complete. Verification of patient identification                            for the specimen was done. Estimated blood loss was                            minimal.                           The exam was otherwise without abnormality on                            direct and retroflexion views. Complications:            No immediate complications. Estimated Blood Loss:     Estimated blood loss was minimal. Impression:               - Six diminutive polyps in the transverse colon and                            in the ascending colon, removed with a cold  snare.                            Resected and retrieved.                           - The examination was otherwise normal on direct                            and retroflexion views.                           - Personal history of colonic polyps. 3 diminutive                            adenomas 2018 Recommendation:           - Patient has a contact number available for                            emergencies. The signs and symptoms of potential                            delayed complications  were discussed with the                            patient. Return to normal activities tomorrow.                            Written discharge instructions were provided to the                            patient.                           - Resume previous diet.                           - Continue present medications.                           - Await pathology results.                           - No recommendation at this time regarding repeat                            colonoscopy due to age. Iva Boop, MD 03/30/2023 10:52:07 AM This report has been signed electronically.

## 2023-03-30 NOTE — Progress Notes (Signed)
Pt's states no medical or surgical changes since previsit or office visit. 

## 2023-03-30 NOTE — Patient Instructions (Addendum)
I found and removed 5 tiny polyps. I will get them analyzed and let you know what they were.  I do not think I will recommend a routine repeat colonoscopy.  I appreciate the opportunity to care for you. Carl E. Gessner, MD, [facility]  Recommendation: - Patient has a contact number available for                            emergencies. The signs and symptoms of potential                            delayed complications were discussed with the                            patient. Return to normal activities tomorrow.                            Written discharge instructions were provided to the                            patient.                           - Resume previous diet.                           - Continue present medications.                           - Await pathology results.                           - No recommendation at this time regarding repeat                            colonoscopy due to age.  Handout on polyps given.  YOU HAD AN ENDOSCOPIC PROCEDURE TODAY AT THE [facility]:   Refer to the procedure report that was given to you for any specific questions about what was found during the examination.  If the procedure report does not answer your questions, please call your gastroenterologist to clarify.  If you requested that your care partner not be given the details of your procedure findings, then the procedure report has been included in a sealed envelope for you to review at your convenience later.  YOU SHOULD EXPECT: Some feelings of bloating in the abdomen. Passage of more gas than usual.  Walking can help get rid of the air that was put into your GI tract during the procedure and reduce the bloating. If you had a lower endoscopy (such as a colonoscopy or flexible sigmoidoscopy) you may notice spotting of blood in your stool or on the toilet paper. If you underwent a bowel prep for your procedure, you may not have a normal bowel movement for a few days.  Please  Note:  You might notice some irritation and congestion in your nose or some drainage.  This is from the oxygen used during your procedure.  There is no need for concern and it should clear up in a day or so.  SYMPTOMS TO  REPORT IMMEDIATELY:  Following lower endoscopy (colonoscopy or flexible sigmoidoscopy):  Excessive amounts of blood in the stool  Significant tenderness or worsening of abdominal pains  Swelling of the abdomen that is new, acute  Fever of 100F or higher  For urgent or emergent issues, a gastroenterologist can be reached at any hour by calling [phone]. Do not use MyChart messaging for urgent concerns.   DIET:  We do recommend a small meal at first, but then you may proceed to your regular diet.  Drink plenty of fluids but you should avoid alcoholic beverages for 24 hours.  ACTIVITY:  You should plan to take it easy for the rest of today and you should NOT DRIVE or use heavy machinery until tomorrow (because of the sedation medicines used during the test).    FOLLOW UP: Our staff will call the number listed on your records the next business day following your procedure.  We will call around 7:15- 8:00 am to check on you and address any questions or concerns that you may have regarding the information given to you following your procedure. If we do not reach you, we will leave a message.     If any biopsies were taken you will be contacted by phone or by letter within the next 1-3 weeks.  Please call us at [phone] if you have not heard about the biopsies in 3 weeks.    SIGNATURES/CONFIDENTIALITY: You and/or your care partner have signed paperwork which will be entered into your electronic medical record.  These signatures attest to the fact that that the information above on your After Visit Summary has been reviewed and is understood.  Full responsibility of the confidentiality of this discharge information lies with you and/or your care-partner.

## 2023-03-30 NOTE — Progress Notes (Signed)
Gastroenterology History and Physical   Primary Care Physician:  Tower, Audrie Gallus, MD   Reason for Procedure:   Hx colon polyps  Plan:    colonoscopy     HPI: Kylie Acosta is a 77 y.o. female s/p removal 3 adenomas (diminutive) 2018   Past Medical History:  Diagnosis Date   Allergy    allerlgic rhinitis   Arthritis    prior to hip surgery   Cataract    GERD (gastroesophageal reflux disease)    Hx of adenomatous colonic polyps    Hyperlipidemia    Osteopenia    Osteoporosis     Past Surgical History:  Procedure Laterality Date   ABDOMINAL HYSTERECTOMY  1997   fibroids   COLONOSCOPY     [place]/2005   HIP SURGERY  08/14/2019    Prior to Admission medications   Medication Sig Start Date End Date Taking? Authorizing Provider  cholecalciferol (VITAMIN D) 1000 UNITS tablet Take 2,000 Units by mouth daily.    Yes [provider]  fluticasone (FLONASE) 50 MCG/ACT nasal spray SHAKE LIQUID AND USE 2 SPRAYS IN EACH NOSTRIL EVERY DAY 01/03/23  Yes Tower, Audrie Gallus, MD  Multiple Vitamin (MULTIVITAMIN) capsule Take 1 capsule by mouth daily.   Yes [provider]  simvastatin (ZOCOR) 20 MG tablet TAKE 1 TABLET(20 MG) BY MOUTH DAILY 02/22/23  Yes Tower, Audrie Gallus, MD  denosumab (PROLIA) 60 MG/ML SOSY injection Inject 60 mg into the skin every 6 (six) months.    [provider]  desoximetasone (TOPICORT) 0.25 % cream Apply 1 Application topically 2 (two) times daily. To eczema on hands 11/16/22   Tower, Audrie Gallus, MD    Current Outpatient Medications  Medication Sig Dispense Refill   cholecalciferol (VITAMIN D) 1000 UNITS tablet Take 2,000 Units by mouth daily.      fluticasone (FLONASE) 50 MCG/ACT nasal spray SHAKE LIQUID AND USE 2 SPRAYS IN EACH NOSTRIL EVERY DAY 48 g 0   Multiple Vitamin (MULTIVITAMIN) capsule Take 1 capsule by mouth daily.     simvastatin (ZOCOR) 20 MG tablet TAKE 1 TABLET(20 MG) BY MOUTH DAILY 90 tablet 3   denosumab (PROLIA) 60  MG/ML SOSY injection Inject 60 mg into the skin every 6 (six) months.     desoximetasone (TOPICORT) 0.25 % cream Apply 1 Application topically 2 (two) times daily. To eczema on hands 30 g 3   Current Facility-Administered Medications  Medication Dose Route Frequency Provider Last Rate Last Admin   0.9 %  sodium chloride infusion  500 mL Intravenous Once Iva Boop, MD       denosumab (PROLIA) injection 60 mg  60 mg Subcutaneous Once Ermalene Searing, Amy E, MD        Allergies as of 03/30/2023 - Review Complete 03/30/2023  Allergen Reaction Noted   Benadryl [diphenhydramine] Itching 08/09/2017   Alendronate sodium     Atorvastatin  07/07/2010   Famotidine  07/07/2010   Omeprazole  07/07/2010   Raloxifene     Sulfonamide derivatives      Family History  Problem Relation Age of Onset   Colon cancer Paternal Aunt    Rectal cancer Neg Hx    Stomach cancer Neg Hx    Colon polyps Neg Hx    Esophageal cancer Neg Hx     Social History   Socioeconomic History   Marital status: Married    Spouse name: Not on file   Number of children: Not on file   Years of  education: Not on file   Highest education level: Not on file  Occupational History   Not on file  Tobacco Use   Smoking status: Never   Smokeless tobacco: Never  Vaping Use   Vaping Use: Never used  Substance and Sexual Activity   Alcohol use: No    Alcohol/week: 0.0 standard drinks of alcohol   Drug use: No   Sexual activity: Never  Other Topics Concern   Not on file  Social History Narrative   Not on file   Social Determinants of Health   Financial Resource Strain: Low Risk  (02/22/2023)   Overall Financial Resource Strain (CARDIA)    Difficulty of Paying Living Expenses: Not hard at all  Food Insecurity: No Food Insecurity (02/22/2023)   Hunger Vital Sign    Worried About Running Out of Food in the Last Year: Never true    Ran Out of Food in the Last Year: Never true  Transportation Needs: No Transportation Needs  (02/22/2023)   PRAPARE - Administrator, Civil Service (Medical): No    Lack of Transportation (Non-Medical): No  Physical Activity: Sufficiently Active (02/22/2023)   Exercise Vital Sign    Days of Exercise per Week: 7 days    Minutes of Exercise per Session: 30 min  Stress: No Stress Concern Present (02/22/2023)   [employer] of Occupational Health - Occupational Stress Questionnaire    Feeling of Stress : Not at all  Social Connections: Socially Integrated (02/22/2023)   Social Connection and Isolation Panel [NHANES]    Frequency of Communication with Friends and Family: More than three times a week    Frequency of Social Gatherings with Friends and Family: More than three times a week    Attends Religious Services: More than 4 times per year    Active Member of [employer] or Organizations: Yes    Attends Engineer, structural: More than 4 times per year    Marital Status: Married  Catering manager Violence: Not At Risk (02/22/2023)   Humiliation, Afraid, Rape, and Kick questionnaire    Fear of Current or Ex-Partner: No    Emotionally Abused: No    Physically Abused: No    Sexually Abused: No    Review of Systems:  All other review of systems negative except as mentioned in the HPI.  Physical Exam: Vital signs BP (!) 152/92   Pulse 77   Temp (!) 97.5 F (36.4 C)   Ht  (1.6 m)   Wt 132 lb (59.9 kg)   SpO2 99%   BMI 23.38 kg/m   General:   Alert,  Well-developed, well-nourished, pleasant and cooperative in NAD Lungs:  Clear throughout to auscultation.   Heart:  Regular rate and rhythm; no murmurs, clicks, rubs,  or gallops. Abdomen:  Soft, nontender and nondistended. Normal bowel sounds.   Neuro/Psych:  Alert and cooperative. Normal mood and affect. A and O x 3    Sena Slate, MD, [facility] Gastroenterology [phone] (pager) 03/30/2023 9:58 AM@

## 2023-03-31 ENCOUNTER — Telehealth: Payer: Self-pay

## 2023-03-31 NOTE — Telephone Encounter (Signed)
  Follow up Call-     03/30/2023    9:48 AM  Call back number  Post procedure Call Back phone  # [phone]  Permission to leave phone message Yes     Patient questions:  Do you have a fever, pain , or abdominal swelling? No. Pain Score  0 *  Have you tolerated food without any problems? Yes.    Have you been able to return to your normal activities? Yes.    Do you have any questions about your discharge instructions: Diet   No. Medications  No. Follow up visit  No.  Do you have questions or concerns about your Care? No.  Actions: * If pain score is 4 or above: No action needed, pain <4.

## 2023-04-05 ENCOUNTER — Other Ambulatory Visit: Payer: Self-pay

## 2023-04-14 ENCOUNTER — Encounter: Payer: Self-pay | Admitting: Internal Medicine

## 2023-04-19 ENCOUNTER — Other Ambulatory Visit: Payer: Self-pay | Admitting: Family Medicine

## 2023-04-19 DIAGNOSIS — E7800 Pure hypercholesterolemia, unspecified: Secondary | ICD-10-CM

## 2023-06-21 ENCOUNTER — Telehealth: Payer: Self-pay | Admitting: Family Medicine

## 2023-06-21 NOTE — Telephone Encounter (Signed)
Patient would like to be scheduled for her 6 month prolia labs and injection

## 2023-06-28 NOTE — Telephone Encounter (Signed)
Patient called in regarding getting scheduled for her prolia labs and injection.

## 2023-07-04 ENCOUNTER — Other Ambulatory Visit (HOSPITAL_COMMUNITY): Payer: Self-pay

## 2023-07-04 ENCOUNTER — Telehealth: Payer: Self-pay

## 2023-07-04 NOTE — Telephone Encounter (Signed)
Pharmacy Patient Advocate Encounter   Received notification from  [facility] Portal that prior authorization for PROLIA 60MG  is required/requested.   Insurance verification completed.   The patient is insured through [place] .   Per test claim: PA submitted to [facility] via CoverMyMeds Key/confirmation #/EOC  [id number] Status is pending

## 2023-07-04 NOTE — Telephone Encounter (Signed)
Created new encounter for Prolia BIV. Will route encounter back once benefit verification is complete.  

## 2023-07-04 NOTE — Telephone Encounter (Signed)
Prolia VOB initiated via [email or web address]  Last Prolia inj: 12/28/22 Next Prolia inj DUE:  06/28/23

## 2023-07-04 NOTE — Telephone Encounter (Signed)
Pharmacy Patient Advocate Encounter  Received notification from [facility] that Prior Authorization for Prolia 60mg  has been APPROVED from 04/02/20 to 12/13/23.Marland Kitchen  PA #/Case ID/Reference #: [id number]

## 2023-07-05 ENCOUNTER — Other Ambulatory Visit (HOSPITAL_COMMUNITY): Payer: Self-pay

## 2023-07-05 NOTE — Telephone Encounter (Signed)
Pt ready for scheduling for PROLIA on or after : 07/05/23  Out-of-pocket cost due at time of visit: $80  Primary: [employer] Prolia co-insurance: $40 Admin fee co-insurance: $40  Secondary: --- Prolia co-insurance:  Admin fee co-insurance:   Medical Benefit Details: Date Benefits were checked: 07/04/23 Deductible: NO/ Coinsurance: $40/ Admin Fee: $40  Prior Auth: APPROVED PA# [id number] Expiration Date: 04/02/20-12/13/23  # of doses approved: 2  Pharmacy benefit: Copay $64 If patient wants fill through the pharmacy benefit please send prescription to: [employer], and include estimated need by date in rx notes. Pharmacy will ship medication directly to the office.  Patient not eligible for Prolia Copay Card. Copay Card can make patient's cost as little as $25. Link to apply: [email or web address]://[email or web address]  ** This summary of benefits is an estimation of the patient's out-of-pocket cost. Exact cost may very based on individual plan coverage.

## 2023-07-06 NOTE — Telephone Encounter (Signed)
Patient returned call regarding getting scheduled for her prolia labs and injection.She would like a call back.

## 2023-07-06 NOTE — Telephone Encounter (Signed)
Called patient left message to call office. Will need lab and prolia set up.

## 2023-07-07 ENCOUNTER — Other Ambulatory Visit: Payer: Self-pay

## 2023-07-07 DIAGNOSIS — M810 Age-related osteoporosis without current pathological fracture: Secondary | ICD-10-CM

## 2023-07-07 NOTE — Telephone Encounter (Signed)
Called patient reviewed all following information including appointment, Co pay due at time of visit and if pick up of injection is needed from outside pharmacy.     Out of pocket for patient: $80  Lab appointment : 7/29  Nurse visit:  07/14/23  Lab order placed: Yes  Prolia has been  [x]   Ordered  []   Script sent to local pharmacy for patient to bring   []   Script sent to Specialty pharmacy   []   Script sent to [facility] [employer] to deliver

## 2023-07-07 NOTE — Telephone Encounter (Signed)
Left message to return call to our office.  

## 2023-07-07 NOTE — Telephone Encounter (Signed)
Pt called back returning Joellen's call. Pt states she'll be home for the remaining of day, so she or Joellen won't have to keep missing each other's calls. Call back # [phone]

## 2023-07-11 ENCOUNTER — Other Ambulatory Visit (INDEPENDENT_AMBULATORY_CARE_PROVIDER_SITE_OTHER): Payer: Medicare PPO

## 2023-07-11 DIAGNOSIS — M810 Age-related osteoporosis without current pathological fracture: Secondary | ICD-10-CM

## 2023-07-14 ENCOUNTER — Ambulatory Visit: Payer: Medicare PPO

## 2023-07-14 DIAGNOSIS — M810 Age-related osteoporosis without current pathological fracture: Secondary | ICD-10-CM

## 2023-07-14 MED ORDER — DENOSUMAB 60 MG/ML ~~LOC~~ SOSY
60.0000 mg | PREFILLED_SYRINGE | Freq: Once | SUBCUTANEOUS | Status: AC
Start: 2023-07-14 — End: 2023-07-14
  Administered 2023-07-14: 60 mg via SUBCUTANEOUS

## 2023-07-14 NOTE — Progress Notes (Signed)
Per orders of Dr. Roxy Manns, injection of Prolia  given by Nanci Pina in left deltoid. Patient tolerated injection well.

## 2023-08-07 ENCOUNTER — Other Ambulatory Visit: Payer: Self-pay | Admitting: Family Medicine

## 2023-08-08 ENCOUNTER — Other Ambulatory Visit: Payer: Self-pay | Admitting: Family Medicine

## 2023-10-12 DIAGNOSIS — L821 Other seborrheic keratosis: Secondary | ICD-10-CM | POA: Diagnosis not present

## 2023-10-12 DIAGNOSIS — D2261 Melanocytic nevi of right upper limb, including shoulder: Secondary | ICD-10-CM | POA: Diagnosis not present

## 2023-10-12 DIAGNOSIS — D2271 Melanocytic nevi of right lower limb, including hip: Secondary | ICD-10-CM | POA: Diagnosis not present

## 2023-10-12 DIAGNOSIS — D225 Melanocytic nevi of trunk: Secondary | ICD-10-CM | POA: Diagnosis not present

## 2023-10-12 DIAGNOSIS — S80862A Insect bite (nonvenomous), left lower leg, initial encounter: Secondary | ICD-10-CM | POA: Diagnosis not present

## 2023-10-12 DIAGNOSIS — D2272 Melanocytic nevi of left lower limb, including hip: Secondary | ICD-10-CM | POA: Diagnosis not present

## 2023-10-12 DIAGNOSIS — L820 Inflamed seborrheic keratosis: Secondary | ICD-10-CM | POA: Diagnosis not present

## 2023-10-12 DIAGNOSIS — S80861A Insect bite (nonvenomous), right lower leg, initial encounter: Secondary | ICD-10-CM | POA: Diagnosis not present

## 2023-10-12 DIAGNOSIS — D2262 Melanocytic nevi of left upper limb, including shoulder: Secondary | ICD-10-CM | POA: Diagnosis not present

## 2023-10-14 ENCOUNTER — Other Ambulatory Visit: Payer: Self-pay

## 2023-12-12 ENCOUNTER — Telehealth: Payer: Self-pay

## 2023-12-12 DIAGNOSIS — M810 Age-related osteoporosis without current pathological fracture: Secondary | ICD-10-CM

## 2023-12-12 NOTE — Telephone Encounter (Signed)
Last shot was on 8/1 Then I think 6 months would be after 2/1?

## 2023-12-12 NOTE — Telephone Encounter (Signed)
Please advise did not see documentation on price for patient.

## 2023-12-12 NOTE — Telephone Encounter (Signed)
Copied from CRM [id number]. Topic: General - Other >> Dec 12, 2023  2:06 PM Taleah C wrote: Reason for CRM: pt called and stated that she was notified by her insurance that she has been approved to receive her next Prolia injection. Please verify when patient is ready to schedule. Pt asked to receive a callback to [phone].

## 2024-01-04 MED ORDER — DENOSUMAB 60 MG/ML ~~LOC~~ SOSY
60.0000 mg | PREFILLED_SYRINGE | Freq: Once | SUBCUTANEOUS | Status: AC
  Administered 2024-01-26: 60 mg via SUBCUTANEOUS

## 2024-01-04 NOTE — Addendum Note (Signed)
Addended by: Donnamarie Poag on: 01/04/2024 02:29 PM   Modules accepted: Orders

## 2024-01-04 NOTE — Telephone Encounter (Signed)
Pt called for a update on the Prolia injection and blood work

## 2024-01-04 NOTE — Telephone Encounter (Signed)
Have started new order do not see auth for this injections

## 2024-01-09 ENCOUNTER — Other Ambulatory Visit (HOSPITAL_COMMUNITY): Payer: Self-pay

## 2024-01-09 ENCOUNTER — Telehealth: Payer: Self-pay

## 2024-01-09 NOTE — Telephone Encounter (Signed)
Prolia VOB initiated via [email or web address]  Next Prolia inj DUE: 01/11/24

## 2024-01-09 NOTE — Telephone Encounter (Signed)
Prolia BIV in separate encounter.

## 2024-01-10 NOTE — Telephone Encounter (Signed)
Marland Kitchen

## 2024-01-10 NOTE — Telephone Encounter (Signed)
Pharmacy Patient Advocate Encounter   Received notification from  [facility] Portal that prior authorization for PROLIA is required/requested.   Insurance verification completed.   The patient is insured through [place] .   Per test claim: PA required; PA submitted to above mentioned insurance via CoverMyMeds Key/confirmation #/EOC  [id number] Status is pending

## 2024-01-13 ENCOUNTER — Other Ambulatory Visit (HOSPITAL_COMMUNITY): Payer: Self-pay

## 2024-01-13 NOTE — Telephone Encounter (Signed)
Pt ready for scheduling for PROLIA on or after : 01/13/24  Out-of-pocket cost due at time of visit: $40  Number of injection/visits approved: 2  Primary: HU[employer] Prolia co-insurance: $40 Admin fee co-insurance: 0%  Secondary: --- Prolia co-insurance:  Admin fee co-insurance:   Medical Benefit Details: Date Benefits were checked: 01/09/24 Deductible: NO/ Coinsurance: $40/ Admin Fee: 0%  Prior Auth: APPROVED PA# [id number] Expiration Date: 04/02/20-12/12/24  # of doses approved: 2  Pharmacy benefit: Copay $64 If patient wants fill through the pharmacy benefit please send prescription to: [employer], and include estimated need by date in rx notes. Pharmacy will ship medication directly to the office.  Patient NOT eligible for Prolia Copay Card. Copay Card can make patient's cost as little as $25. Link to apply: [email or web address]://[email or web address]  ** This summary of benefits is an estimation of the patient's out-of-pocket cost. Exact cost may very based on individual plan coverage.

## 2024-01-13 NOTE — Telephone Encounter (Signed)
Pharmacy Patient Advocate Encounter  Received notification from [facility] that Prior Authorization for PROLIA has been APPROVED from 04/02/20 to 12/12/24. Ran test claim, Copay is $64. This test claim was processed through [facility]- copay amounts may vary at other pharmacies due to pharmacy/plan contracts, or as the patient moves through the different stages of their insurance plan.   PA #/Case ID/Reference #: [id number]

## 2024-01-16 NOTE — Telephone Encounter (Signed)
Prolia notice of previous approval letter indexed to media tab.   Authorization is good until 12/12/2024.

## 2024-01-20 DIAGNOSIS — Z961 Presence of intraocular lens: Secondary | ICD-10-CM | POA: Diagnosis not present

## 2024-01-20 DIAGNOSIS — H524 Presbyopia: Secondary | ICD-10-CM | POA: Diagnosis not present

## 2024-01-20 DIAGNOSIS — H52223 Regular astigmatism, bilateral: Secondary | ICD-10-CM | POA: Diagnosis not present

## 2024-01-23 ENCOUNTER — Telehealth: Payer: Self-pay | Admitting: Family Medicine

## 2024-01-23 DIAGNOSIS — M810 Age-related osteoporosis without current pathological fracture: Secondary | ICD-10-CM

## 2024-01-23 DIAGNOSIS — E7800 Pure hypercholesterolemia, unspecified: Secondary | ICD-10-CM

## 2024-01-23 DIAGNOSIS — K219 Gastro-esophageal reflux disease without esophagitis: Secondary | ICD-10-CM

## 2024-01-23 DIAGNOSIS — E559 Vitamin D deficiency, unspecified: Secondary | ICD-10-CM

## 2024-01-23 DIAGNOSIS — Z860101 Personal history of adenomatous and serrated colon polyps: Secondary | ICD-10-CM

## 2024-01-23 NOTE — Telephone Encounter (Signed)
-----   Message from Darcella Earnest sent at 01/19/2024  1:34 PM EST ----- Regarding: Labs for Tuesday 2.11.25 Please put lab orders in future. Thank you, [place]

## 2024-01-24 ENCOUNTER — Other Ambulatory Visit (INDEPENDENT_AMBULATORY_CARE_PROVIDER_SITE_OTHER): Payer: Medicare PPO

## 2024-01-24 DIAGNOSIS — E559 Vitamin D deficiency, unspecified: Secondary | ICD-10-CM | POA: Diagnosis not present

## 2024-01-24 DIAGNOSIS — Z860101 Personal history of adenomatous and serrated colon polyps: Secondary | ICD-10-CM

## 2024-01-24 DIAGNOSIS — K219 Gastro-esophageal reflux disease without esophagitis: Secondary | ICD-10-CM | POA: Diagnosis not present

## 2024-01-24 DIAGNOSIS — M810 Age-related osteoporosis without current pathological fracture: Secondary | ICD-10-CM | POA: Diagnosis not present

## 2024-01-24 DIAGNOSIS — E7800 Pure hypercholesterolemia, unspecified: Secondary | ICD-10-CM

## 2024-01-24 LAB — BASIC METABOLIC PANEL
BUN: 13 mg/dL (ref 6–23)
CO2: 32 meq/L (ref 19–32)
Calcium: 9.1 mg/dL (ref 8.4–10.5)
Chloride: 102 meq/L (ref 96–112)
Creatinine, Ser: 0.64 mg/dL (ref 0.40–1.20)
GFR: 83.79 mL/min (ref >60.00)
Glucose, Bld: 89 mg/dL (ref 70–99)
Potassium: 4.5 meq/L (ref 3.5–5.1)
Sodium: 140 meq/L (ref 135–145)

## 2024-01-24 LAB — HEPATIC FUNCTION PANEL
ALT: 18 U/L (ref 0–35)
AST: 23 U/L (ref 0–37)
Albumin: 4.5 g/dL (ref 3.5–5.2)
Alkaline Phosphatase: 41 U/L (ref 39–117)
Bilirubin, Direct: 0.2 mg/dL (ref 0.0–0.3)
Total Bilirubin: 0.8 mg/dL (ref 0.2–1.2)
Total Protein: 6.8 g/dL (ref 6.0–8.3)

## 2024-01-24 LAB — CBC WITH DIFFERENTIAL/PLATELET
Basophils Absolute: 0.1 10*3/uL (ref 0.0–0.1)
Basophils Relative: 1.1 % (ref 0.0–3.0)
Eosinophils Absolute: 0.2 10*3/uL (ref 0.0–0.7)
Eosinophils Relative: 3.9 % (ref 0.0–5.0)
HCT: 42.1 % (ref 36.0–46.0)
Hemoglobin: 14.1 g/dL (ref 12.0–15.0)
Lymphocytes Relative: 27.7 % (ref 12.0–46.0)
Lymphs Abs: 1.6 10*3/uL (ref 0.7–4.0)
MCHC: 33.5 g/dL (ref 30.0–36.0)
MCV: 96.8 fL (ref 78.0–100.0)
Monocytes Absolute: 0.4 10*3/uL (ref 0.1–1.0)
Monocytes Relative: 6.6 % (ref 3.0–12.0)
Neutro Abs: 3.5 10*3/uL (ref 1.4–7.7)
Neutrophils Relative %: 60.7 % (ref 43.0–77.0)
Platelets: 288 10*3/uL (ref 150.0–400.0)
RBC: 4.35 Mil/uL (ref 3.87–5.11)
RDW: 12.6 % (ref 11.5–15.5)
WBC: 5.8 10*3/uL (ref 4.0–10.5)

## 2024-01-24 LAB — LIPID PANEL
Cholesterol: 178 mg/dL (ref 0–200)
HDL: 82.3 mg/dL (ref >39.00)
LDL Cholesterol: 78 mg/dL (ref 0–99)
NonHDL: 95.23
Total CHOL/HDL Ratio: 2
Triglycerides: 84 mg/dL (ref 0.0–149.0)
VLDL: 16.8 mg/dL (ref 0.0–40.0)

## 2024-01-24 LAB — VITAMIN D 25 HYDROXY (VIT D DEFICIENCY, FRACTURES): VITD: 47.41 ng/mL (ref 30.00–100.00)

## 2024-01-24 LAB — TSH: TSH: 3.32 u[IU]/mL (ref 0.35–5.50)

## 2024-01-26 ENCOUNTER — Ambulatory Visit: Payer: Medicare PPO

## 2024-01-26 DIAGNOSIS — M810 Age-related osteoporosis without current pathological fracture: Secondary | ICD-10-CM | POA: Diagnosis not present

## 2024-01-26 MED ORDER — DENOSUMAB 60 MG/ML ~~LOC~~ SOSY
60.0000 mg | PREFILLED_SYRINGE | Freq: Once | SUBCUTANEOUS | Status: AC
  Administered 2024-07-31: 60 mg via SUBCUTANEOUS

## 2024-01-26 MED ORDER — DENOSUMAB 60 MG/ML ~~LOC~~ SOSY: 60.0000 mg | PREFILLED_SYRINGE | Freq: Once | SUBCUTANEOUS | Status: DC

## 2024-01-26 NOTE — Progress Notes (Signed)
Per orders of Dr. Kerby Nora, injection of Prolia  given by Nanci Pina in left deltoid. Patient tolerated injection well.

## 2024-02-01 ENCOUNTER — Other Ambulatory Visit: Payer: Self-pay

## 2024-02-07 DIAGNOSIS — Z1231 Encounter for screening mammogram for malignant neoplasm of breast: Secondary | ICD-10-CM | POA: Diagnosis not present

## 2024-02-07 LAB — HM MAMMOGRAPHY

## 2024-02-27 ENCOUNTER — Encounter: Payer: Medicare PPO | Admitting: Family Medicine

## 2024-02-27 ENCOUNTER — Ambulatory Visit (INDEPENDENT_AMBULATORY_CARE_PROVIDER_SITE_OTHER): Payer: Medicare PPO | Admitting: Family Medicine

## 2024-02-27 ENCOUNTER — Encounter: Payer: Self-pay | Admitting: Family Medicine

## 2024-02-27 VITALS — BP 132/78 | HR 66 | Temp 97.9°F | Ht 63.25 in | Wt 132.2 lb

## 2024-02-27 DIAGNOSIS — M810 Age-related osteoporosis without current pathological fracture: Secondary | ICD-10-CM | POA: Diagnosis not present

## 2024-02-27 DIAGNOSIS — E7800 Pure hypercholesterolemia, unspecified: Secondary | ICD-10-CM | POA: Diagnosis not present

## 2024-02-27 DIAGNOSIS — J301 Allergic rhinitis due to pollen: Secondary | ICD-10-CM | POA: Diagnosis not present

## 2024-02-27 DIAGNOSIS — Z860101 Personal history of adenomatous and serrated colon polyps: Secondary | ICD-10-CM

## 2024-02-27 DIAGNOSIS — Z0000 Encounter for general adult medical examination without abnormal findings: Secondary | ICD-10-CM | POA: Diagnosis not present

## 2024-02-27 DIAGNOSIS — E559 Vitamin D deficiency, unspecified: Secondary | ICD-10-CM

## 2024-02-27 NOTE — Assessment & Plan Note (Signed)
 Last vitamin D Lab Results  Component Value Date   VD25OH 47.41 01/24/2024   Vitamin D level is therapeutic with current supplementation Disc importance of this to bone and overall health

## 2024-02-27 NOTE — Assessment & Plan Note (Signed)
 Reviewed health habits including diet and exercise and skin cancer prevention Reviewed appropriate screening tests for age  Also reviewed health mt list, fam hx and immunization status , as well as social and family history   See HPI Labs reviewed and ordered Missed flu shot this season-plans to get next fall  Discussed fall prevention, supplements and exercise for bone density  Utd derm care PHQ 0

## 2024-02-27 NOTE — Assessment & Plan Note (Signed)
 Utd colonoscopy 03/2023

## 2024-02-27 NOTE — Patient Instructions (Addendum)
 Keep walking  Add some strength training to your routine, this is important for bone and brain health and can reduce your risk of falls and help your body use insulin properly and regulate weight  Light weights, exercise bands , and internet videos are a good way to start  Yoga (chair or regular), machines , floor exercises or a gym with machines are also good options    Keep taking good care of yourself   Keep eating fruits and veggies    If flonase gives you a headache but you still need something- try nasacort over the counter

## 2024-02-27 NOTE — Assessment & Plan Note (Signed)
 No longer tolerates flonase (headache) If needed/ may do better with nasacort over the counter since it has less fragrance

## 2024-02-27 NOTE — Assessment & Plan Note (Signed)
 Disc goals for lipids and reasons to control them Rev last labs with pt Rev low sat fat diet in detail  Simvastatin 20 mg daily  Improved LDl and HDL

## 2024-02-27 NOTE — Assessment & Plan Note (Signed)
 Dexa utd 02/2023  One fall no fx Disc fall prevention  Continue vit D Add strength training if possible   Great walking habits

## 2024-02-27 NOTE — Progress Notes (Signed)
 Subjective:    Patient ID: Kylie Acosta, female    DOB: [birth year], 78 y.o.   MRN: [id number]  HPI  Here for health maintenance exam and to review chronic medical problems   Wt Readings from Last 3 Encounters:  02/27/24 132 lb 4 oz (60 kg)  03/30/23 132 lb (59.9 kg)  03/02/23 132 lb (59.9 kg)   23.24 kg/m  Vitals:   02/27/24 1055  BP: 132/78  Pulse: 66  Temp: 97.9 F (36.6 C)  SpO2: 97%    Immunization History  Administered Date(s) Administered   Fluad Quad(high Dose 65+) 11/02/2019, 10/22/2022   Influenza Split 11/30/2011, 11/29/2012   Influenza Whole 09/12/2002, 10/11/2008, 10/07/2009, 10/22/2010   Influenza,inj,Quad PF,6+ Mos 11/07/2013, 10/22/2015, 10/08/2016, 10/13/2017, 08/22/2018   Influenza-Unspecified 11/12/2021   PFIZER(Purple Top)SARS-COV-2 Vaccination 01/02/2020, 01/25/2020, 12/22/2020   Pfizer Covid-19 Vaccine Bivalent Booster 59yrs & up 10/01/2021   Pneumococcal Conjugate-13 05/01/2014   Pneumococcal Polysaccharide-23 01/11/2012   Td 10/14/2003, 04/06/2013   Zoster Recombinant(Shingrix) 04/05/2023, 10/14/2023   Zoster, Live 07/24/2007    Health Maintenance Due  Topic Date Due   Medicare Annual Wellness (AWV)  02/22/2024   Has not had flu shot this season   Mammogram 01/2024  Self breast exam- no lumps   Gyn health No problems    Colon cancer screening -colonoscopy 03/2023    Bone health  Dexa  02/2023  Osteoporosis Taking prolia   Falls- none  Fractures-none  Supplements  vit D Last vitamin D Lab Results  Component Value Date   VD25OH 47.41 01/24/2024    Exercise  Walking regularly   Dermatology visit n oct  Is up to date   Mood    02/27/2024   11:01 AM 02/22/2023   10:48 AM 01/11/2022    9:54 AM 11/13/2020    9:37 AM 10/29/2019   10:09 AM  Depression screen PHQ 2/9  Decreased Interest 0 0 0 0 0  Down, Depressed, Hopeless 0 0 0 0 0  PHQ - 2 Score 0 0 0 0 0  Altered sleeping 0    0  Tired, decreased energy 0    0   Change in appetite 0    0  Feeling bad or failure about yourself  0    0  Trouble concentrating 0    0  Moving slowly or fidgety/restless 0    0  Suicidal thoughts 0    0  PHQ-9 Score 0    0  Difficult doing work/chores Not difficult at all    Not difficult at all    Hyperlipidemia Lab Results  Component Value Date   CHOL 178 01/24/2024   CHOL 195 12/14/2022   CHOL 173 12/09/2021   Lab Results  Component Value Date   HDL 82.30 01/24/2024   HDL 77.50 12/14/2022   HDL 69.80 12/09/2021   Lab Results  Component Value Date   LDLCALC 78 01/24/2024   LDLCALC 100 (H) 12/14/2022   LDLCALC 89 12/09/2021   Lab Results  Component Value Date   TRIG 84.0 01/24/2024   TRIG 84.0 12/14/2022   TRIG 70.0 12/09/2021   Lab Results  Component Value Date   CHOLHDL 2 01/24/2024   CHOLHDL 3 12/14/2022   CHOLHDL 2 12/09/2021   Lab Results  Component Value Date   LDLDIRECT 151.5 09/01/2010   Simvastatin 20 mg daily  Tolerated well  Watching diet   Other labs Lab Results  Component Value Date   NA 140 01/24/2024   K  4.5 01/24/2024   CO2 32 01/24/2024   GLUCOSE 89 01/24/2024   BUN 13 01/24/2024   CREATININE 0.64 01/24/2024   CALCIUM 9.1 01/24/2024   GFR 83.79 01/24/2024   GFRNONAA 77.28 09/01/2010   Lab Results  Component Value Date   ALT 18 01/24/2024   AST 23 01/24/2024   ALKPHOS 41 01/24/2024   BILITOT 0.8 01/24/2024    Lab Results  Component Value Date   WBC 5.8 01/24/2024   HGB 14.1 01/24/2024   HCT 42.1 01/24/2024   MCV 96.8 01/24/2024   PLT 288.0 01/24/2024   Lab Results  Component Value Date   TSH 3.32 01/24/2024      Patient Active Problem List   Diagnosis Date Noted   Estrogen deficiency 07/24/2018   Hx of adenomatous colonic polyps 10/26/2017   Routine general medical examination at a health care facility 07/30/2015   Vitamin D deficiency 07/30/2015   GERD 07/07/2010   VAGINITIS, ATROPHIC 10/15/2009   Osteoporosis 07/19/2007   Hyperlipidemia  07/14/2007   Allergic rhinitis 07/14/2007   Eczema of hand 07/14/2007   Past Medical History:  Diagnosis Date   Allergy    allerlgic rhinitis   Arthritis    prior to hip surgery   Cataract    GERD (gastroesophageal reflux disease)    Hx of adenomatous colonic polyps    Hyperlipidemia    Osteopenia    Osteoporosis    Past Surgical History:  Procedure Laterality Date   ABDOMINAL HYSTERECTOMY  1997   fibroids   COLONOSCOPY     [place]/2005   HIP SURGERY  08/14/2019   Social History   Tobacco Use   Smoking status: Never   Smokeless tobacco: Never  Vaping Use   Vaping status: Never Used  Substance Use Topics   Alcohol use: No    Alcohol/week: 0.0 standard drinks of alcohol   Drug use: No   Family History  Problem Relation Age of Onset   Colon cancer Paternal Aunt    Rectal cancer Neg Hx    Stomach cancer Neg Hx    Colon polyps Neg Hx    Esophageal cancer Neg Hx    Allergies  Allergen Reactions   Benadryl [Diphenhydramine] Itching    Severe itching. Allergic reaction to cream.   Alendronate Sodium     REACTION: gerd   Atorvastatin     REACTION: muscle and joint pain   Famotidine     REACTION: not effective   Omeprazole     REACTION: not effective   Raloxifene     REACTION: hot flashes   Sulfonamide Derivatives     REACTION: whelps   Current Outpatient Medications on File Prior to Visit  Medication Sig Dispense Refill   cholecalciferol (VITAMIN D) 1000 UNITS tablet Take 2,000 Units by mouth daily.      denosumab (PROLIA) 60 MG/ML SOSY injection Inject 60 mg into the skin every 6 (six) months.     desoximetasone (TOPICORT) 0.25 % cream Apply 1 Application topically 2 (two) times daily. To eczema on hands 30 g 3   Multiple Vitamin (MULTIVITAMIN) capsule Take 1 capsule by mouth daily.     simvastatin (ZOCOR) 20 MG tablet TAKE 1 TABLET(20 MG) BY MOUTH DAILY 90 tablet 3   Current Facility-Administered Medications on File Prior to Visit  Medication Dose Route  Frequency Provider Last Rate Last Admin   denosumab (PROLIA) injection 60 mg  60 mg Subcutaneous Once Excell Seltzer, MD       [  START ON 07/25/2024] denosumab (PROLIA) injection 60 mg  60 mg Subcutaneous Once Dametria Tuzzolino A, MD        Review of Systems  Constitutional:  Negative for activity change, appetite change, fatigue, fever and unexpected weight change.  HENT:  Negative for congestion, ear pain, rhinorrhea, sinus pressure and sore throat.   Eyes:  Negative for pain, redness and visual disturbance.  Respiratory:  Negative for cough, shortness of breath and wheezing.   Cardiovascular:  Negative for chest pain and palpitations.  Gastrointestinal:  Negative for abdominal pain, blood in stool, constipation and diarrhea.  Endocrine: Negative for polydipsia and polyuria.  Genitourinary:  Negative for dysuria, frequency and urgency.  Musculoskeletal:  Positive for arthralgias. Negative for back pain and myalgias.  Skin:  Negative for pallor and rash.  Allergic/Immunologic: Negative for environmental allergies.  Neurological:  Negative for dizziness, syncope and headaches.  Hematological:  Negative for adenopathy. Does not bruise/bleed easily.  Psychiatric/Behavioral:  Negative for decreased concentration and dysphoric mood. The patient is not nervous/anxious.        Objective:   Physical Exam Constitutional:      General: She is not in acute distress.    Appearance: Normal appearance. She is well-developed. She is not ill-appearing or diaphoretic.  HENT:     Head: Normocephalic and atraumatic.     Right Ear: Tympanic membrane, ear canal and external ear normal.     Left Ear: Tympanic membrane, ear canal and external ear normal.     Nose: Nose normal. No congestion.     Mouth/Throat:     Mouth: Mucous membranes are moist.     Pharynx: Oropharynx is clear. No posterior oropharyngeal erythema.  Eyes:     General: No scleral icterus.    Extraocular Movements: Extraocular movements  intact.     Conjunctiva/sclera: Conjunctivae normal.     Pupils: Pupils are equal, round, and reactive to light.  Neck:     Thyroid: No thyromegaly.     Vascular: No carotid bruit or JVD.  Cardiovascular:     Rate and Rhythm: Normal rate and regular rhythm.     Pulses: Normal pulses.     Heart sounds: Normal heart sounds.     No gallop.  Pulmonary:     Effort: Pulmonary effort is normal. No respiratory distress.     Breath sounds: Normal breath sounds. No wheezing.     Comments: Good air exch Chest:     Chest wall: No tenderness.  Abdominal:     General: Bowel sounds are normal. There is no distension or abdominal bruit.     Palpations: Abdomen is soft. There is no mass.     Tenderness: There is no abdominal tenderness.     Hernia: No hernia is present.  Genitourinary:    Comments: Breast exam: No mass, nodules, thickening, tenderness, bulging, retraction, inflamation, nipple discharge or skin changes noted.  No axillary or clavicular LA.     Musculoskeletal:        General: No tenderness. Normal range of motion.     Cervical back: Normal range of motion and neck supple. No rigidity. No muscular tenderness.     Right lower leg: No edema.     Left lower leg: No edema.     Comments: No kyphosis   Lymphadenopathy:     Cervical: No cervical adenopathy.  Skin:    General: Skin is warm and dry.     Coloration: Skin is not pale.  Findings: No erythema or rash.     Comments: Solar lentigines diffusely Some sks   Neurological:     Mental Status: She is alert. Mental status is at baseline.     Cranial Nerves: No cranial nerve deficit.     Motor: No abnormal muscle tone.     Coordination: Coordination normal.     Gait: Gait normal.     Deep Tendon Reflexes: Reflexes are normal and symmetric.  Psychiatric:        Mood and Affect: Mood normal.        Cognition and Memory: Cognition and memory normal.     Comments: Sharp  Good mood             Assessment & Plan:    Problem List Items Addressed This Visit       Respiratory   Allergic rhinitis   No longer tolerates flonase (headache) If needed/ may do better with nasacort over the counter since it has less fragrance         Digestive   GERD     Musculoskeletal and Integument   Osteoporosis   Dexa utd 02/2023  One fall no fx Disc fall prevention  Continue vit D Add strength training if possible   Great walking habits          Other   Vitamin D deficiency   Last vitamin D Lab Results  Component Value Date   VD25OH 47.41 01/24/2024   Vitamin D level is therapeutic with current supplementation Disc importance of this to bone and overall health       Routine general medical examination at a health care facility - Primary   Reviewed health habits including diet and exercise and skin cancer prevention Reviewed appropriate screening tests for age  Also reviewed health mt list, fam hx and immunization status , as well as social and family history   See HPI Labs reviewed and ordered Missed flu shot this season-plans to get next fall  Discussed fall prevention, supplements and exercise for bone density  Utd derm care PHQ 0       Hyperlipidemia   Disc goals for lipids and reasons to control them Rev last labs with pt Rev low sat fat diet in detail  Simvastatin 20 mg daily  Improved LDl and HDL       Hx of adenomatous colonic polyps   Utd colonoscopy 03/2023

## 2024-03-09 ENCOUNTER — Other Ambulatory Visit: Payer: Self-pay

## 2024-03-09 MED ORDER — BOOSTRIX 5-2.5-18.5 LF-MCG/0.5 IM SUSY
PREFILLED_SYRINGE | INTRAMUSCULAR | 0 refills | Status: DC
  Filled 2024-03-09: qty 0.5, 1d supply, fill #0

## 2024-04-02 DIAGNOSIS — M25561 Pain in right knee: Secondary | ICD-10-CM | POA: Diagnosis not present

## 2024-04-24 ENCOUNTER — Ambulatory Visit (INDEPENDENT_AMBULATORY_CARE_PROVIDER_SITE_OTHER): Payer: Medicare PPO

## 2024-04-24 VITALS — Ht 63.25 in | Wt 132.0 lb

## 2024-04-24 DIAGNOSIS — Z0000 Encounter for general adult medical examination without abnormal findings: Secondary | ICD-10-CM

## 2024-04-24 NOTE — Patient Instructions (Signed)
 Kylie Acosta , Thank you for taking time out of your busy schedule to complete your Annual Wellness Visit with me. I enjoyed our conversation and look forward to speaking with you again next year. I, as well as your care team,  appreciate your ongoing commitment to your health goals. Please review the following plan we discussed and let me know if I can assist you in the future. Your Game plan/ To Do List     Follow up Visits: Next Medicare AWV with our clinical staff: 04/25/25 @ 3:40 pm   Have you seen your provider in the last 6 months (3 months if uncontrolled diabetes)? Yes Next Office Visit with your provider: 02/28/25 @ 10:00am  Clinician Recommendations:  Aim for 30 minutes of exercise or brisk walking, 6-8 glasses of water, and 5 servings of fruits and vegetables each day.       This is a list of the screening recommended for you and due dates:  Health Maintenance  Topic Date Due   Medicare Annual Wellness Visit  02/22/2024   COVID-19 Vaccine (5 - 2024-25 season) 03/14/2025*   Flu Shot  07/13/2024   Mammogram  02/06/2025   DTaP/Tdap/Td vaccine (4 - Td or Tdap) 03/09/2034   Pneumonia Vaccine  Completed   DEXA scan (bone density measurement)  Completed   Zoster (Shingles) Vaccine  Completed   HPV Vaccine  Aged Out   Meningitis B Vaccine  Aged Out   Colon Cancer Screening  Discontinued   Hepatitis C Screening  Discontinued  *Topic was postponed. The date shown is not the original due date.    Advanced directives: (In Chart) A copy of your advanced directives are scanned into your chart should your provider ever need it. Advance Care Planning is important because it:  [x]  Makes sure you receive the medical care that is consistent with your values, goals, and preferences  [x]  It provides guidance to your family and loved ones and reduces their decisional burden about whether or not they are making the right decisions based on your wishes.  Follow the link provided in your after  visit summary or read over the paperwork we have mailed to you to help you started getting your Advance Directives in place. If you need assistance in completing these, please reach out to us  so that we can help you!
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
## 2024-04-27 ENCOUNTER — Other Ambulatory Visit: Payer: Self-pay | Admitting: Family Medicine

## 2024-04-27 DIAGNOSIS — E7800 Pure hypercholesterolemia, unspecified: Secondary | ICD-10-CM

## 2024-05-30 DIAGNOSIS — H0100A Unspecified blepharitis right eye, upper and lower eyelids: Secondary | ICD-10-CM | POA: Diagnosis not present

## 2024-06-25 ENCOUNTER — Telehealth: Payer: Self-pay

## 2024-06-25 NOTE — Telephone Encounter (Signed)
 Prolia  VOB initiated via [email or web address]  Next Prolia  inj DUE: 07/25/24

## 2024-06-26 ENCOUNTER — Other Ambulatory Visit (HOSPITAL_COMMUNITY): Payer: Self-pay

## 2024-06-26 NOTE — Telephone Encounter (Signed)
 [employer] can no longer give estimates/quotes on non-chemotherapy drugs. Provider must follow the Fee schedule or use CMS website.   Following Medicare Advantage guideline: 20% coinsurance

## 2024-06-26 NOTE — Telephone Encounter (Signed)
 Pt ready for scheduling for PROLIA  on or after : 07/25/24  Option# 1: Buy/Bill (Office supplied medication)  Out-of-pocket cost due at time of clinic visit: $357  Number of injection/visits approved: 2  Primary: [employer] Prolia  co-insurance: 20% Admin fee co-insurance: 20%  Secondary: --- Prolia  co-insurance:  Admin fee co-insurance:   Medical Benefit Details: Date Benefits were checked: 06/26/24 Deductible: NO/ Coinsurance: 20%/ Admin Fee: 20%  Prior Auth: APPROVED PA# [id number] Expiration Date: 04/02/20-12/12/24   # of doses approved: 2 ----------------------------------------------------------------------- Option# 2- Med Obtained from pharmacy:  Pharmacy benefit: Copay $64 (Paid to pharmacy) Admin Fee: 20% (Pay at clinic)  Prior Auth: N/A PA# Expiration Date:   # of doses approved:   If patient wants fill through the pharmacy benefit please send prescription to: [employer], and include estimated need by date in rx notes. Pharmacy will ship medication directly to the office.  Patient NOT eligible for Prolia  Copay Card. Copay Card can make patient's cost as little as $25. Link to apply: [email or web address]://[email or web address].[email or web address]/copay  ** This summary of benefits is an estimation of the patient's out-of-pocket cost. Exact cost may very based on individual plan coverage.

## 2024-06-27 ENCOUNTER — Other Ambulatory Visit: Payer: Self-pay

## 2024-06-27 ENCOUNTER — Other Ambulatory Visit: Payer: Self-pay | Admitting: Family Medicine

## 2024-06-27 DIAGNOSIS — M810 Age-related osteoporosis without current pathological fracture: Secondary | ICD-10-CM

## 2024-06-27 MED ORDER — DENOSUMAB 60 MG/ML ~~LOC~~ SOSY
60.0000 mg | PREFILLED_SYRINGE | SUBCUTANEOUS | 0 refills | Status: DC
  Filled 2024-06-27: qty 180, fill #0
  Filled 2024-07-16: qty 1, 180d supply, fill #0

## 2024-06-28 ENCOUNTER — Other Ambulatory Visit: Payer: Self-pay

## 2024-06-28 NOTE — Progress Notes (Signed)
 Pharmacy Patient Advocate Encounter  Insurance verification completed.   The patient is insured through [employer]   Ran test claim for Prolia. Co-pay is $64.  This test claim was processed through [facility]- copay amounts may vary at other pharmacies due to pharmacy/plan contracts, or as the patient moves through the different stages of their insurance plan.

## 2024-06-29 ENCOUNTER — Other Ambulatory Visit: Payer: Self-pay

## 2024-07-03 ENCOUNTER — Other Ambulatory Visit: Payer: Self-pay

## 2024-07-06 ENCOUNTER — Other Ambulatory Visit: Payer: Self-pay

## 2024-07-10 ENCOUNTER — Other Ambulatory Visit (HOSPITAL_COMMUNITY): Payer: Self-pay

## 2024-07-11 ENCOUNTER — Other Ambulatory Visit (HOSPITAL_COMMUNITY): Payer: Self-pay

## 2024-07-11 NOTE — Progress Notes (Signed)
 Patient has not returned phone calls-dis-enrolling.

## 2024-07-16 ENCOUNTER — Other Ambulatory Visit: Payer: Self-pay

## 2024-07-16 ENCOUNTER — Other Ambulatory Visit (HOSPITAL_COMMUNITY): Payer: Self-pay

## 2024-07-16 NOTE — Progress Notes (Signed)
 Specialty Pharmacy Initial Fill Coordination Note  Kylie Acosta is a 78 y.o. female contacted today regarding initial fill of specialty medication(s) Denosumab  (PROLIA )   Patient requested Courier to Provider Office   Delivery date: 07/23/24   Verified address: [facility]   Medication will be filled on 8/8.   Patient is aware of $64 copayment.

## 2024-07-20 ENCOUNTER — Other Ambulatory Visit: Payer: Self-pay

## 2024-07-23 ENCOUNTER — Ambulatory Visit: Payer: Self-pay | Admitting: Family Medicine

## 2024-07-23 ENCOUNTER — Other Ambulatory Visit (INDEPENDENT_AMBULATORY_CARE_PROVIDER_SITE_OTHER)

## 2024-07-23 DIAGNOSIS — M810 Age-related osteoporosis without current pathological fracture: Secondary | ICD-10-CM | POA: Diagnosis not present

## 2024-07-23 LAB — BASIC METABOLIC PANEL WITH GFR
BUN: 17 mg/dL (ref 6–23)
CO2: 31 meq/L (ref 19–32)
Calcium: 9 mg/dL (ref 8.4–10.5)
Chloride: 100 meq/L (ref 96–112)
Creatinine, Ser: 0.8 mg/dL (ref 0.40–1.20)
GFR: 69.61 mL/min (ref >60.00)
Glucose, Bld: 99 mg/dL (ref 70–99)
Potassium: 4.3 meq/L (ref 3.5–5.1)
Sodium: 139 meq/L (ref 135–145)

## 2024-07-26 ENCOUNTER — Ambulatory Visit

## 2024-07-31 ENCOUNTER — Ambulatory Visit (INDEPENDENT_AMBULATORY_CARE_PROVIDER_SITE_OTHER)

## 2024-07-31 DIAGNOSIS — M810 Age-related osteoporosis without current pathological fracture: Secondary | ICD-10-CM | POA: Diagnosis not present

## 2024-07-31 MED ORDER — DENOSUMAB 60 MG/ML ~~LOC~~ SOSY: 60.0000 mg | PREFILLED_SYRINGE | SUBCUTANEOUS | Status: AC

## 2024-07-31 NOTE — Progress Notes (Signed)
 Per orders of Dr. Laine Balls, injection of Prolia   given by Nellie Hummer in left arm. Patient tolerated injection well. Office will reach out to patient once next injection is due.

## 2024-08-02 ENCOUNTER — Other Ambulatory Visit: Payer: Self-pay

## 2024-08-23 DIAGNOSIS — L309 Dermatitis, unspecified: Secondary | ICD-10-CM | POA: Diagnosis not present

## 2024-11-19 ENCOUNTER — Ambulatory Visit: Payer: Self-pay

## 2024-11-19 NOTE — Telephone Encounter (Signed)
 FYI Only or Action Required?: FYI only for provider: appointment scheduled on 11/20/2024 at .  Patient was last seen in primary care on 02/27/2024 by Randeen Laine LABOR, MD.  Called Nurse Triage reporting Rash.  Symptoms began about a month ago.  Interventions attempted: Prescription medications: Triamcinolone  Acetonide Cream and Other: oatmeal bath.  Symptoms are: improving but not completely resolved.  Triage Disposition: See PCP When Office is Open (Within 3 Days)  Patient/caregiver understands and will follow disposition?: Yes                Copied from CRM #[id number]. Topic: Clinical - Red Word Triage >> Nov 19, 2024 11:00 AM Franky GRADE wrote: Red Word that prompted transfer to Nurse Triage: Patient is experiencing an allergic reaction to a medication she's taking but does not know which medication is causing the reaction. Rash, itching. Dermotologist prescribed a cream but it does not seem to be helping with symptoms. Reason for Disposition  Mild widespread rash  (Exception: Heat rash lasting 3 days or less.)  Answer Assessment - Initial Assessment Questions Rash x 1 month On chest, shoulders, back, down both arms Dermatologist did a biopsy and it is a result of a medication & she was advised to see her PCP  Triamcinolone  Acetonide Cream  Patient denies chest pain, difficulty breathing, fevers Patient states she needs to see her PCP to discuss her medications and see what could be causing this issue She states the itching is not bad and the cream from her dermatologist has helped a lot  Patient is advised to call us  back if anything changes or with any further questions/concerns. Patient is advised that if anything worsens to go to the Emergency Room. Patient verbalized understanding.  Protocols used: Rash or Redness - [facility]

## 2024-11-19 NOTE — Telephone Encounter (Signed)
 Records request sent.

## 2024-11-19 NOTE — Telephone Encounter (Signed)
 Will see her then Please send for derm note if we can get it, thanks

## 2024-11-20 ENCOUNTER — Ambulatory Visit: Admitting: Family Medicine

## 2024-11-20 ENCOUNTER — Encounter: Payer: Self-pay | Admitting: Family Medicine

## 2024-11-20 VITALS — BP 134/76 | HR 75 | Temp 97.8°F | Ht 63.25 in | Wt 138.2 lb

## 2024-11-20 DIAGNOSIS — M810 Age-related osteoporosis without current pathological fracture: Secondary | ICD-10-CM | POA: Diagnosis not present

## 2024-11-20 DIAGNOSIS — Z23 Encounter for immunization: Secondary | ICD-10-CM

## 2024-11-20 DIAGNOSIS — L432 Lichenoid drug reaction: Secondary | ICD-10-CM | POA: Diagnosis not present

## 2024-11-20 NOTE — Assessment & Plan Note (Signed)
 Dexa 02/2023 Taking prolia   Has a lichenoid drug reaction (chest/arms/upper back) and this is most likely the cause  No falls or fractures  In past- did not tolerate oral alendronate (GI) or raloxifene (hot flashes)   Will need to hold prolia  when due in feb and do not want decrease in bone density in the gap  Referred for help with osteoporosis management with ortho care   Discussed fall prevention, supplements and exercise for bone density

## 2024-11-20 NOTE — Assessment & Plan Note (Addendum)
 Arms/chest/upper back and back of neck  Psoriaform patches  Some itch  Reviewed dermatology notes and bx result  Triamcinolone  0.1 % is helping some -does not feel need to increase her topical steroid strength at this time  ? If prolia  is cause Will need to consider other opt for osteoporosis and hold it when due in feb  Call back and Er precautions noted in detail today  Will continue contact with dermatology  I personally spent a total of 31  minutes in the care of the patient today including preparing to see the patient, getting/reviewing separately obtained history, performing a medically appropriate exam/evaluation, counseling and educating, placing orders, referring and communicating with other health care professionals, and documenting clinical information in the EHR.

## 2024-11-20 NOTE — Patient Instructions (Addendum)
 I put the referral in for osteoporosis care  Please let us  know if you don't hear in 1-2 weeks to set that up (mychart message or call or letter)   I do wonder if the prolia  is the cause   Continue the triamcinolone  cream Keep in contact with dermatology  Let us  and them know if symptoms worsen  Avoid hot water  Avoid harsh detergents

## 2024-11-20 NOTE — Progress Notes (Signed)
 Subjective:    Patient ID: Kylie Acosta, female    DOB: 11-26-1946, 78 y.o.   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, lichenoid drug eruption)   Nsaid is on med list -meloxicam   Not taking it often    Triamcinolone  cream  0.1%  Is helping some  Oatmeal bath    Has history of eczema on hands Uses desoximetasone  (topicort ) -medium potency   Next prolia  is due in feb   Chest Upper arms  Upper back and neck  Some itching -not severe     Patient Active Problem List   Diagnosis Date Noted   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   Estrogen deficiency 07/24/2018   Hx of adenomatous colonic polyps 10/26/2017   Routine general medical examination at a health care facility 07/30/2015   Vitamin D  deficiency 07/30/2015   GERD 07/07/2010   VAGINITIS, ATROPHIC 10/15/2009   Osteoporosis 07/19/2007   Hyperlipidemia 07/14/2007   Allergic rhinitis 07/14/2007   Eczema of hand 07/14/2007   Past Medical History:  Diagnosis Date   Allergy    allerlgic rhinitis   Arthritis    prior to hip surgery   Cataract    GERD (gastroesophageal reflux disease)    Hx of adenomatous colonic polyps    Hyperlipidemia    Osteopenia    Osteoporosis    Past Surgical History:  Procedure Laterality Date   ABDOMINAL HYSTERECTOMY  1997   fibroids   COLONOSCOPY     [facility]/2005   HIP SURGERY  08/14/2019   Social History   Tobacco Use   Smoking status: Never   Smokeless  tobacco: Never  Vaping Use   Vaping status: Never Used  Substance Use Topics   Alcohol use: No    Alcohol/week: 0.0 standard drinks of alcohol   Drug use: No   Family History  Problem Relation Age of Onset   Colon cancer Paternal Aunt    Rectal cancer Neg Hx    Stomach cancer Neg Hx    Colon polyps Neg Hx    Esophageal cancer Neg Hx    Allergies  Allergen Reactions   Benadryl [Diphenhydramine] Itching    Severe itching. Allergic reaction to cream.   Alendronate Sodium     REACTION: gerd   Atorvastatin     REACTION: muscle and joint pain   Famotidine     REACTION: not effective   Omeprazole     REACTION: not effective   Raloxifene     REACTION: hot flashes   Sulfonamide Derivatives     REACTION: whelps   Current Outpatient Medications on File Prior to Visit  Medication Sig Dispense Refill   cholecalciferol (VITAMIN D ) 1000 UNITS tablet Take 2,000 Units by mouth daily.      desoximetasone  (TOPICORT ) 0.25 % cream Apply 1 Application topically 2 (two) times daily.  To eczema on hands 30 g 3   meloxicam  (MOBIC ) 7.5 MG tablet TAKE 1 TABLET BY MOUTH EVERY DAY WITH A MEAL     Multiple Vitamin (MULTIVITAMIN) capsule Take 1 capsule by mouth daily.     simvastatin  (ZOCOR ) 20 MG tablet TAKE 1 TABLET(20 MG) BY MOUTH DAILY 90 tablet 3   triamcinolone  cream (KENALOG ) 0.1 % Apply 1 Application topically 2 (two) times daily as needed.     Current Facility-Administered Medications on File Prior to Visit  Medication Dose Route Frequency Provider Last Rate Last Admin   denosumab  (PROLIA ) injection 60 mg  60 mg Subcutaneous Once Bedsole, Kylie E, MD       [START ON 01/27/2025] denosumab  (PROLIA ) injection 60 mg  60 mg Subcutaneous Q6 months Kylie Acosta, Kylie LABOR, MD        Review of Systems  Constitutional:  Negative for activity change, appetite change, fatigue, fever and unexpected weight change.  HENT:  Negative for congestion, ear pain, rhinorrhea, sinus pressure and sore throat.   Eyes:   Negative for pain, redness and visual disturbance.  Respiratory:  Negative for cough, shortness of breath and wheezing.   Cardiovascular:  Negative for chest pain and palpitations.  Gastrointestinal:  Negative for abdominal pain, blood in stool, constipation and diarrhea.  Endocrine: Negative for polydipsia and polyuria.  Genitourinary:  Negative for dysuria, frequency and urgency.  Musculoskeletal:  Negative for arthralgias, back pain and myalgias.  Skin:  Positive for rash. Negative for pallor.  Allergic/Immunologic: Negative for environmental allergies.  Neurological:  Negative for dizziness, syncope and headaches.  Hematological:  Negative for adenopathy. Does not bruise/bleed easily.  Psychiatric/Behavioral:  Negative for decreased concentration and dysphoric mood. The patient is not nervous/anxious.        Objective:   Physical Exam Constitutional:      General: She is not in acute distress.    Appearance: Normal appearance. She is normal weight. She is not ill-appearing.  Eyes:     Conjunctiva/sclera: Conjunctivae normal.     Pupils: Pupils are equal, round, and reactive to light.  Cardiovascular:     Rate and Rhythm: Normal rate and regular rhythm.  Pulmonary:     Effort: Pulmonary effort is normal. No respiratory distress.     Breath sounds: Normal breath sounds. No stridor. No wheezing, rhonchi or rales.  Musculoskeletal:        General: No swelling.     Cervical back: Neck supple.  Lymphadenopathy:     Cervical: No cervical adenopathy.  Skin:    General: Skin is warm and dry.     Findings: Rash present.     Comments: Patchy erythematous rash on chest/upper arms/ upper back and posterior neck  Raised patches/some plaque like and pink in color  No vesicles No excoriations     Solar lentigines diffusely   Neurological:     Mental Status: She is alert.  Psychiatric:        Mood and Affect: Mood normal.           Assessment & Plan:   Problem List Items  Addressed This Visit       Musculoskeletal and Integument   Osteoporosis   Dexa 02/2023 Taking prolia   Has a lichenoid drug reaction (chest/arms/upper back) and this is most likely the cause  No falls or fractures  In past- did not tolerate oral alendronate (GI) or raloxifene (hot flashes)   Will need to hold prolia  when due in feb and do not want  decrease in bone density in the gap  Referred for help with osteoporosis management with ortho care   Discussed fall prevention, supplements and exercise for bone density        Relevant Orders   Amb Referral to Osteoporosis Management    Lichenoid drug reaction - Primary   Arms/chest/upper back and back of neck  Psoriaform patches  Some itch  Reviewed dermatology notes and bx result  Triamcinolone  0.1 % is helping some -does not feel need to increase her topical steroid strength at this time  ? If prolia  is cause Will need to consider other opt for osteoporosis and hold it when due in feb  Call back and Er precautions noted in detail today  Will continue contact with dermatology

## 2024-12-20 ENCOUNTER — Encounter: Admitting: Physician Assistant

## 2024-12-24 ENCOUNTER — Encounter: Admitting: Physician Assistant

## 2025-01-01 ENCOUNTER — Ambulatory Visit: Admitting: Physician Assistant

## 2025-01-01 ENCOUNTER — Encounter: Payer: Self-pay | Admitting: Physician Assistant

## 2025-01-01 VITALS — BP 120/60 | Ht 65.0 in | Wt 137.6 lb

## 2025-01-01 DIAGNOSIS — M8000XA Age-related osteoporosis with current pathological fracture, unspecified site, initial encounter for fracture: Secondary | ICD-10-CM

## 2025-01-01 NOTE — Progress Notes (Signed)
 "  Office Visit Note   Patient: Kylie Acosta           Date of Birth: 01/12/1946           MRN: [id number] Visit Date: 01/01/2025              Requested by: Tower, Laine LABOR, MD [address] [place],  [place] [address] PCP: Randeen Laine LABOR, MD   Assessment & Plan: Visit Diagnoses:  1. Age-related osteoporosis with current pathological fracture, initial encounter     Plan: Patient is a pleasant active 79 year old woman that was referred from Dr. Randeen.  She has a history of osteoporosis.  She originally many years ago was started on Fosamax but could not tolerate its GI effects.  Most recently she has been on Prolia  for a while and then very well and even managed to improve her bone density score from osteoporosis to osteopenia.  She is due for her next injection in February.  Unfortunately she had a lichenoid skin reaction to Prolia  which was biopsied and diagnosed as being secondary to a medication and she only takes 1 other medication and it coordinated with her Prolia  injection.  Because of this she was referred for evaluation for other medications.  She has never had a fracture.  No history of heart disease or cancer.  She does not have a history of kidney disease no history of ulcers but did have severe reflux with the Fosamax.  She does not have a history of epilepsy or seizures and went through menopause at 27 she she does get pregnant calcium through her diet I did encourage her to track exactly how much calcium is getting I gave her information about calcium intake.  She has done hormone replacement therapy in the past.  She has never been a smoker she does not drink she does walk 4 to 5 days a week.  I encouraged her to try to engage in some kind of resistance training program twice a week.  She has not had any major dental work and no family history of fragility fracture.  She is due for a new bone density scan and will arrange this for mid-to-late March.  I spent 45 minutes reviewing  her chart and talking with her.  She does have some options.  She could try Reclast.  This is an injection infusion 1 time per year.  She is looking to wonder if any of the other bisphosphonates would have less the chance of causing the GI side effects as she would prefer to take a pill I am going to review this and see if there is one that has less of the GI side effects that she could always try and if she has had the same reaction and she would have to go to Reclast also give her information on Evenity which is another possibility however she still would have to goes onto a maintenance medication  Follow-Up Instructions: Return if symptoms worsen or fail to improve.   Orders:  No orders of the defined types were placed in this encounter.  No orders of the defined types were placed in this encounter.     Procedures: No procedures performed   Clinical Data: No additional findings.   Subjective: Chief Complaint  Patient presents with   Osteoporosis    HPI pleasant 79 year old woman comes in today referral from Dr. Randeen for evaluation of osteoporosis medications  Review of Systems  All other systems reviewed and  are negative.    Objective: Vital Signs: Ht 5' 5 (1.651 m)   Wt 137 lb 9.6 oz (62.4 kg)   BMI 22.90 kg/m   Physical Exam Constitutional:      Appearance: Normal appearance.  Pulmonary:     Effort: Pulmonary effort is normal.  Skin:    General: Skin is warm and dry.  Neurological:     General: No focal deficit present.     Mental Status: She is alert and oriented to person, place, and time.  Psychiatric:        Mood and Affect: Mood normal.        Behavior: Behavior normal.       Specialty Comments:  No specialty comments available.  Imaging: No results found.   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   Estrogen deficiency 07/24/2018   Hx of adenomatous colonic polyps 10/26/2017    Routine general medical examination at a health care facility 07/30/2015   Vitamin D  deficiency 07/30/2015   GERD 07/07/2010   VAGINITIS, ATROPHIC 10/15/2009   Osteoporosis 07/19/2007   Hyperlipidemia 07/14/2007   Allergic rhinitis 07/14/2007   Eczema of hand 07/14/2007   Past Medical History:  Diagnosis Date   Allergy    allerlgic rhinitis   Arthritis    prior to hip surgery   Cataract    GERD (gastroesophageal reflux disease)    Hx of adenomatous colonic polyps    Hyperlipidemia    Osteopenia    Osteoporosis     Family History  Problem Relation Age of Onset   Colon cancer Paternal Aunt    Rectal cancer Neg Hx    Stomach cancer Neg Hx    Colon polyps Neg Hx    Esophageal cancer Neg Hx     Past Surgical History:  Procedure Laterality Date   ABDOMINAL HYSTERECTOMY  1997   fibroids   COLONOSCOPY     [facility]/2005   HIP SURGERY  08/14/2019   Social History   Occupational History   Not on file  Tobacco Use   Smoking status: Never   Smokeless tobacco: Never  Vaping Use   Vaping status: Never Used  Substance and Sexual Activity   Alcohol use: No    Alcohol/week: 0.0 standard drinks of alcohol   Drug use: No   Sexual activity: Never        "

## 2025-01-01 NOTE — Addendum Note (Signed)
 Addended by: GLADIS JACOBSEN on: 01/01/2025 02:26 PM   Modules accepted: Orders

## 2025-01-02 ENCOUNTER — Telehealth: Payer: Self-pay

## 2025-01-02 NOTE — Telephone Encounter (Signed)
 Prolia  VOB initiated via [email or web address]  Next Prolia  inj DUE: 01/31/25

## 2025-01-03 ENCOUNTER — Other Ambulatory Visit: Payer: Self-pay | Admitting: Physician Assistant

## 2025-01-03 ENCOUNTER — Other Ambulatory Visit (HOSPITAL_COMMUNITY): Payer: Self-pay

## 2025-01-03 NOTE — Telephone Encounter (Signed)
 SABRA

## 2025-01-03 NOTE — Telephone Encounter (Signed)
 Kylie Acosta

## 2025-01-03 NOTE — Telephone Encounter (Signed)
 Pt ready for scheduling for PROLIA  on or after : 01/31/25  Option# 1: Buy/Bill (Office supplied medication)  Out-of-pocket cost due at time of clinic visit: $0  Number of injection/visits approved: 2  Primary: [employer] Prolia  co-insurance: 0% Admin fee co-insurance: 0%  Secondary: --- Prolia  co-insurance:  Admin fee co-insurance:   Medical Benefit Details: Date Benefits were checked: 01/02/25 Deductible: NO/ Coinsurance: 0%/ Admin Fee: 0%  Prior Auth: APPROVED PA# [id number] Expiration Date: 12/13/24-12/12/25  # of doses approved: 2 ----------------------------------------------------------------------- Option# 2- Med Obtained from pharmacy:  Pharmacy benefit: Copay $64 (Paid to pharmacy) Admin Fee: 0% (Pay at clinic)  Prior Auth: N/A PA# Expiration Date:   # of doses approved:   If patient wants fill through the pharmacy benefit please send prescription to: [facility], and include estimated need by date in rx notes. Pharmacy will ship medication directly to the office.  Patient NOT eligible for Prolia  Copay Card. Copay Card can make patient's cost as little as $25. Link to apply: [email or web address]://[email or web address]  ** This summary of benefits is an estimation of the patient's out-of-pocket cost. Exact cost may very based on individual plan coverage.

## 2025-01-04 ENCOUNTER — Other Ambulatory Visit: Payer: Self-pay

## 2025-01-08 ENCOUNTER — Other Ambulatory Visit: Payer: Self-pay

## 2025-01-09 ENCOUNTER — Other Ambulatory Visit: Payer: Self-pay

## 2025-01-11 ENCOUNTER — Other Ambulatory Visit: Payer: Self-pay

## 2025-01-17 ENCOUNTER — Telehealth: Payer: Self-pay | Admitting: Physician Assistant

## 2025-01-17 NOTE — Telephone Encounter (Signed)
 Pt called wanting to confirm what her next steps are concerning her osteoporosis. Call back number is [phone]. Alternate number is [phone].

## 2025-01-21 ENCOUNTER — Ambulatory Visit: Admitting: Family Medicine

## 2025-01-29 ENCOUNTER — Other Ambulatory Visit

## 2025-02-28 ENCOUNTER — Encounter: Admitting: Family Medicine

## 2025-04-19 ENCOUNTER — Ambulatory Visit

## 2025-04-25 ENCOUNTER — Ambulatory Visit
